# Patient Record
Sex: Female | Born: 1965
Health system: Southern US, Community
[De-identification: ages and names within clinical notes are randomized; demographics above are authoritative.]

## PROBLEM LIST (undated history)

## (undated) DIAGNOSIS — T8859XA Other complications of anesthesia, initial encounter: Secondary | ICD-10-CM

## (undated) DIAGNOSIS — Z87442 Personal history of urinary calculi: Secondary | ICD-10-CM

## (undated) DIAGNOSIS — A6920 Lyme disease, unspecified: Secondary | ICD-10-CM

## (undated) DIAGNOSIS — I10 Essential (primary) hypertension: Secondary | ICD-10-CM

## (undated) DIAGNOSIS — R32 Unspecified urinary incontinence: Secondary | ICD-10-CM

## (undated) DIAGNOSIS — T4145XA Adverse effect of unspecified anesthetic, initial encounter: Secondary | ICD-10-CM

## (undated) DIAGNOSIS — N921 Excessive and frequent menstruation with irregular cycle: Secondary | ICD-10-CM

## (undated) DIAGNOSIS — G4730 Sleep apnea, unspecified: Secondary | ICD-10-CM

## (undated) DIAGNOSIS — M4800 Spinal stenosis, site unspecified: Secondary | ICD-10-CM

## (undated) DIAGNOSIS — M1990 Unspecified osteoarthritis, unspecified site: Secondary | ICD-10-CM

## (undated) DIAGNOSIS — K219 Gastro-esophageal reflux disease without esophagitis: Secondary | ICD-10-CM

## (undated) DIAGNOSIS — F40240 Claustrophobia: Secondary | ICD-10-CM

## (undated) DIAGNOSIS — J45909 Unspecified asthma, uncomplicated: Secondary | ICD-10-CM

## (undated) HISTORY — DX: Lyme disease, unspecified: A69.20

## (undated) HISTORY — DX: Essential (primary) hypertension: I10

## (undated) HISTORY — DX: Spinal stenosis, site unspecified: M48.00

## (undated) HISTORY — DX: Excessive and frequent menstruation with irregular cycle: N92.1

---

## 1996-08-01 HISTORY — PX: LITHOTRIPSY: SUR834

## 2002-04-11 ENCOUNTER — Ambulatory Visit (HOSPITAL_COMMUNITY): Admission: RE | Admit: 2002-04-11 | Discharge: 2002-04-11 | Payer: Self-pay | Admitting: Pulmonary Disease

## 2002-07-29 ENCOUNTER — Other Ambulatory Visit: Admission: RE | Admit: 2002-07-29 | Discharge: 2002-07-29 | Payer: Self-pay | Admitting: Dermatology

## 2004-07-06 ENCOUNTER — Emergency Department (HOSPITAL_COMMUNITY): Admission: EM | Admit: 2004-07-06 | Discharge: 2004-07-06 | Payer: Self-pay | Admitting: Emergency Medicine

## 2005-06-16 ENCOUNTER — Emergency Department (HOSPITAL_COMMUNITY): Admission: EM | Admit: 2005-06-16 | Discharge: 2005-06-16 | Payer: Self-pay | Admitting: Emergency Medicine

## 2005-08-05 ENCOUNTER — Emergency Department (HOSPITAL_COMMUNITY): Admission: EM | Admit: 2005-08-05 | Discharge: 2005-08-05 | Payer: Self-pay | Admitting: Emergency Medicine

## 2010-04-22 ENCOUNTER — Ambulatory Visit: Payer: Self-pay | Admitting: Internal Medicine

## 2010-04-22 DIAGNOSIS — I10 Essential (primary) hypertension: Secondary | ICD-10-CM | POA: Insufficient documentation

## 2010-04-22 DIAGNOSIS — R059 Cough, unspecified: Secondary | ICD-10-CM | POA: Insufficient documentation

## 2010-04-22 DIAGNOSIS — J309 Allergic rhinitis, unspecified: Secondary | ICD-10-CM | POA: Insufficient documentation

## 2010-04-22 DIAGNOSIS — R05 Cough: Secondary | ICD-10-CM

## 2010-06-07 ENCOUNTER — Ambulatory Visit: Payer: Self-pay | Admitting: Internal Medicine

## 2010-07-29 ENCOUNTER — Telehealth (INDEPENDENT_AMBULATORY_CARE_PROVIDER_SITE_OTHER): Payer: Self-pay | Admitting: *Deleted

## 2010-08-31 NOTE — Assessment & Plan Note (Signed)
Summary: 6 weeks/ mbw   Visit Type:  Follow-up Copy to:  Dr. Herb Grays Primary Provider/Referring Provider:  Dr. Herb Grays   History of Present Illness: IOV 04/22/2010: 45 year old female. Referred for cough. Known to have seasonal allergies in spring or fall. STates she gots what she thought was an allergic reaction to change in season in July 2011. She thought though the timing was unusual. She did not think it was URI.  STarted off as runny nose, scratchy throat, hoarseness after talking. Next day she lost voice  and regained within 3 days but 1-2 days days later she developed cough with throat burn. Noticed associated post nasal drip at that time that was triggering the cough. She though symptoms was due to allergy but thought timing was odd. 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 Initially cough was 'violent' cough lasting 15 minutes that was associated with gag and wretching. Episodes such as this were happening every few hours in august. Cough was present day and night. IN Aug 2011 noted to some have relief by being in stting position. So, in last week of august was placed on tessalon pereles, doxycycline 4 day course, flonase, singulair,  claritin and pro-air as needed. Then on 04/06/2010 saw PMD Dr. Yehuda Budd and mucinex added, tussin added and zpak course given to others. This seems to have helped partially transiently but cough recurred with worsening. So on fu a 10 days ago  was placed on doxy (last dose today). Finished singular 4 days ago. Currently cough has improved in terms of frequency but still as intense as before with same associated gag.  Still taking delsym, tessalon, flonase, and claritin. Pro-air seems to help a bit with severe cough episodes. Still feels tickle in back of throat. Denies GERD   Of note,  LISINOPRIL was addted to HCTZ in March-April 2011  for better Hypertenson cotnrol.  Husband has ACE inhibitor cough. She trialled herself of ACE inhibitor but that did not help. REC advise  - stop lisinopril  - take bystolic 5mg  per day      - measure your bp at home or  mall and call us back if >140/90  - take acid reflux treatment  1 pill daily  - follow GERD advice sheet from my nurse  - use netti pot saline wash daily  - use claritin as needed  - suck on sugarless non-mint candy like Jolley Rancer or throat lozenges as needed  - stop other treatment return in 4 weeks   June 07, 2010: Cough is resolved. Did not do GERD measures. Did netti pot 2 times per week. Did not use claritin. Feels stopping ace inhibitor made biggest difference. Lozenges helping. No new problems. OUt of BP med x 7 days   Preventive Screening-Counseling & Management  Alcohol-Tobacco     Smoking Status: never  Current Medications (verified): 1)  Hydrochlorothiazide 25 Mg Tabs (Hydrochlorothiazide) .... Take 1 Tablet By Mouth Once A Day 2)  Bystolic 5 Mg Tabs (Nebivolol Hcl) .... Take 1 Tablet By Mouth Once A Day 3)  Claritin 10 Mg Tabs (Loratadine) .... As Needed  Allergies (verified): 1)  ! Hydrocodone  Past History:  Past medical, surgical, family and social histories (including risk factors) reviewed, and no changes noted (except as noted below).  Past Medical History: Reviewed history from 04/22/2010 and no changes required. Allergic Rhinitis Hypertension  Past Surgical History: Reviewed history from 04/21/2010 and no changes required. 2 c-sections 2 miscarriages lithotrypsy  Family History: Reviewed history from 04/22/2010 and no changes required. father: lung cancer mother: non-alcoholic fatty liver disease, hepatitis?  Social History: Reviewed history from 04/22/2010 and no changes required. Never Smoked Married 2 Engineer, technical sales  Review of Systems  The patient denies shortness of breath with activity, shortness of breath at rest, productive  cough, non-productive cough, coughing up blood, chest pain, irregular heartbeats, acid heartburn, indigestion, loss of appetite, weight change, abdominal pain, difficulty swallowing, sore throat, tooth/dental problems, headaches, nasal congestion/difficulty breathing through nose, sneezing, itching, ear ache, anxiety, depression, hand/feet swelling, joint stiffness or pain, rash, change in color of mucus, and fever.    Vital Signs:  Patient profile:   45 year old female Height:      67 inches Weight:      286 pounds BMI:     44.96 O2 Sat:      99 % on Room air Temp:     98.0 degrees F oral Pulse rate:   74 / minute BP sitting:   134 / 80  (right arm) Cuff size:   l67  Vitals Entered By: Carron Curie CMA (June 07, 2010 3:41 PM)  O2 Flow:  Room air Comments Medications reviewed with patient Carron Curie CMA  June 07, 2010 3:41 PM Daytime phone number verified with patient.    Physical Exam  General:  well developed, well nourished, in no acute distressobese.   Head:  normocephalic and atraumatic Eyes:  PERRLA/EOM intact; conjunctiva and sclera clear Ears:  TMs intact and clear with normal canals Nose:  no deformity, discharge, inflammation, or lesions Mouth:  no deformity or lesions Neck:  no masses, thyromegaly, or abnormal cervical nodes Chest Wall:  no deformities noted Lungs:  clear bilaterally to auscultation and percussion Heart:  regular rate and rhythm, S1, S2 without murmurs, rubs, gallops, or clicks Abdomen:  bowel sounds positive; abdomen soft and non-tender without masses, or organomegaly Msk:  no deformity or scoliosis noted with normal posture Pulses:  pulses normal Extremities:  no clubbing, cyanosis, edema, or deformity noted Neurologic:  CN II-XII grossly intact with normal reflexes, coordination, muscle strength and tone Skin:  intact without lesions or rashes Cervical Nodes:  no significant adenopathy Axillary Nodes:  no significant  adenopathy Psych:  alert and cooperative; normal mood and affect; normal attention span and concentration   Impression & Recommendations:  Problem # 1:  COUGH (ICD-786.2) Assessment Improved  resolved  plan no more ace inhibitors conintue saline wash and as needed lozenges  Orders: Est. Patient Level II (16109)  Problem # 2:  HYPERTENSION (ICD-401.9) Assessment: Unchanged  BP 130/80. Ran out of bystolic 1 week ago  plan 30 days x 5mg  daily bystolic samples instructed to follow with PMD for hypertnesipn control and appropriated drug she will call pharmacy and find out cost for bystolic which she likes  Her updated medication list for this problem includes:    Hydrochlorothiazide 25 Mg Tabs (Hydrochlorothiazide) .Marland Kitchen... Take 1 tablet by mouth once a day    Bystolic 5 Mg Tabs (Nebivolol hcl) .Marland Kitchen... Take 1 tablet by mouth once a day  Orders: Est. Patient Level II (60454)  Medications Added to Medication List This Visit: 1)  Bystolic 5 Mg Tabs (Nebivolol hcl) .... Take 1 tablet by mouth once a day 2)  Claritin 10 Mg Tabs (Loratadine) .... As needed  Patient Instructions: 1)  Glad  cough is gone 2)  continue netti pot and other measures 3)  take 30 day sample of bystolic 5mg  once daily 4)  talk to your pmd and follow with her for bp 5)  no need for active followup unless you have some thing new or cough recurs

## 2010-08-31 NOTE — Assessment & Plan Note (Signed)
Summary: CONSTANT COUGH///KP   Visit Type:  Initial Consult Copy to:  Dr. Herb Grays Primary Provider/Referring Provider:  Dr. Herb Grays  CC:  Pulmonary Consult for dry cough x 2 months..  History of Present Illness: IOV 04/22/2010: 46 year old female. Referred for cough. Known to have seasonal allergies in spring or fall. STates she gots what she thought was an allergic reaction to change in season in July 2011. She thought though the timing was unusual. She did not think it was URI.  STarted off as runny nose, scratchy throat, hoarseness after talking. Next day she lost voice  and regained within 3 days but 1-2 days days later she developed cough with throat burn. Noticed associated post nasal drip at that time that was triggering the cough. She though symptoms was due to allergy but thought timing was odd. 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 Initially cough was 'violent' cough lasting 15 minutes that was associated with gag and wretching. Episodes such as this were happening every few hours in august. Cough was present day and night. IN Aug 2011 noted to some have relief by being in stting position. So, in last week of august was placed on tessalon pereles, doxycycline 4 day course, flonase, singulair,  claritin and pro-air as needed. Then on 04/06/2010 saw PMD Dr. Yehuda Budd and mucinex added, tussin added and zpak course given to others. This seems to have helped partially transiently but cough recurred with worsening. So on fu a 10 days ago  was placed on doxy (last dose today). Finished singular 4 days ago. Currently cough has improved in terms of frequency but still as intense as before with same associated gag.  Still taking delsym, tessalon, flonase, and claritin. Pro-air seems to help a bit with severe cough episodes. Still feels tickle in back of throat. Denies GERD   Of note,  LISINOPRIL was addted to  HCTZ in March-April 2011  for better Hypertenson cotnrol. Husband has ACE inhibitor cough. She trialled herself of ACE inhibitor but that did not help.   Preventive Screening-Counseling & Management  Alcohol-Tobacco     Smoking Status: never  Current Medications (verified): 1)  Doxycycline Hyclate 100 Mg Tabs (Doxycycline Hyclate) .... Take 1 Tablet By Mouth Two Times A Day 2)  Tussionex Pennkinetic Er 10-8 Mg/76ml Lqcr (Hydrocod Polst-Chlorphen Polst) .... Take 1/2 To 1 Tsp By Mouth At Bedtime As Needed For Cough 3)  Tessalon 200 Mg Caps (Benzonatate) .... Take 1 Tablet By Mouth Three Times A Day As Needed For Cough 4)  Hydrochlorothiazide 25 Mg Tabs (Hydrochlorothiazide) .... Take 1 Tablet By Mouth Once A Day 5)  Lisinopril 10 Mg Tabs (Lisinopril) .... Take 1 Tablet By Mouth Once A Day  Allergies (verified): 1)  ! Hydrocodone  Past History:  Past Medical History: Allergic Rhinitis Hypertension  Family History: father: lung cancer mother: non-alcoholic fatty liver disease, hepatitis?  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  The patient denies shortness of breath with activity, shortness of breath at rest, productive cough, coughing up blood, chest pain, irregular heartbeats, acid heartburn, indigestion, loss of appetite, weight change, abdominal pain, difficulty swallowing, sore throat, tooth/dental problems, headaches, nasal congestion/difficulty breathing through nose, sneezing, itching, ear ache, anxiety, depression, hand/feet swelling, joint stiffness or pain, rash,  change in color of mucus, and fever.    Vital Signs:  Patient profile:   45 year old female Height:      67 inches Weight:      286 pounds BMI:     44.96 O2 Sat:      98 % on Room air Temp:     97.3 degrees F oral Pulse rate:   107 / minute BP sitting:   138 / 92  (right arm) Cuff  size:   large  Vitals Entered By: Carron Curie CMA (April 22, 2010 3:37 PM)  O2 Flow:  Room air CC: Pulmonary Consult for dry cough x 2 months. Comments Medications reviewed with patient Carron Curie CMA  April 22, 2010 3:42 PM Daytime phone number verified with patient.    Physical Exam  General:  well developed, well nourished, in no acute distressobese.   Head:  normocephalic and atraumatic Eyes:  PERRLA/EOM intact; conjunctiva and sclera clear Ears:  TMs intact and clear with normal canals Nose:  no deformity, discharge, inflammation, or lesions Mouth:  no deformity or lesions Neck:  no masses, thyromegaly, or abnormal cervical nodes Chest Wall:  no deformities noted Lungs:  clear bilaterally to auscultation and percussion Heart:  regular rate and rhythm, S1, S2 without murmurs, rubs, gallops, or clicks Abdomen:  bowel sounds positive; abdomen soft and non-tender without masses, or organomegaly Msk:  no deformity or scoliosis noted with normal posture Pulses:  pulses normal Extremities:  no clubbing, cyanosis, edema, or deformity noted Neurologic:  CN II-XII grossly intact with normal reflexes, coordination, muscle strength and tone Skin:  intact without lesions or rashes Cervical Nodes:  no significant adenopathy Axillary Nodes:  no significant adenopathy Psych:  alert and cooperative; normal mood and affect; normal attention span and concentration   CXR  Procedure date:  06/16/2005  Findings:      [facility] Accession Number: [id number]    Clinical Data:    Chest tightness.   CHEST - 2 VIEWS:   Comparison:   None.   The heart size and mediastinal contours are within normal limits.   Both lungs are clear.  The visualized skeletal structures are   unremarkable.   IMPRESSION:   No active cardiopulmonary disease.    Read By:  Bernerd Limbo,  M.D.   Released By:  Bernerd Limbo,  M.D.   Comments:      independently reviewed  Impression &  Recommendations:  Problem # 1:  COUGH (ICD-786.2) Assessment New  Post viral reactive cough in summer 2011 with lisinopril, post nasal drip +/- silent GERD fuelling the flame is the likely scenario here. She did not seem fully  convinced ACE inhibitor is playing a role but I explaind to her in great detail and she was accepting. Explained that would rec below Rx plan tacking ace inhibitor, sinus drainage, and possible GERD simultaneosuly and revieiwng after 4 weeks. If still symptomatic, would procced with asthma workup.  PLAN - stop lisinopril  - take bystolic 5mg  per day      - measure your bp at home or  mall and call us back if >140/90  - take acid reflux treatment  1 pill daily  - follow GERD advice sheet from my nurse  - use netti pot saline wash daily  - use claritin as needed  - suck on sugarless non-mint candy like Jolley Rancer or throat lozenges as needed  - stop other treatment   Orders: Consultation Level V (54098)  Problem # 2:  HYPERTENSION (ICD-401.9) Assessment: New change lisinopril to bystolic due to cough continue hctz Her updated medication list for this problem includes:    Hydrochlorothiazide 25 Mg Tabs (Hydrochlorothiazide) .Marland Kitchen... Take 1 tablet by mouth once a day    Bystolic 5 Mg Tabs (Nebivolol hcl) ..... One tablet daily  Medications Added to Medication List This Visit: 1)  Bystolic 5 Mg Tabs (Nebivolol hcl) .... One tablet daily 2)  Omeprazole 20 Mg Cpdr (Omeprazole) .... By mouth daily. take one half hour before eating.  Patient Instructions: 1)  your cough could be post viral, sinus drainage, lisinopril or even silent acid reflux or a combination of these 2)  advise 3)   - stop lisinopril 4)   - take bystolic 5mg  per day 5)       - measure your bp at home or  mall and call us back if >140/90 6)   - take acid reflux treatment  1 pill daily 7)   - follow GERD advice sheet from my nurse 8)   - use netti pot saline wash daily 9)   - use claritin as  needed 10)   - suck on sugarless non-mint candy like Jolley Rancer or throat lozenges as needed 11)   - stop other treatment  12)  return in 4 weeks Prescriptions: OMEPRAZOLE 20 MG  CPDR (OMEPRAZOLE) By mouth daily. Take one half hour before eating.  #30 x 1   Entered and Authorized by:   Kalman Shan MD   Signed by:   Kalman Shan MD on 04/22/2010   Method used:   Historical   RxID:   [id number] BYSTOLIC 5 MG  TABS (NEBIVOLOL HCL) One tablet daily  #30 x 1   Entered and Authorized by:   Kalman Shan MD   Signed by:   Kalman Shan MD on 04/22/2010   Method used:   Historical   RxID:   [id number]    Immunization History:  Influenza Immunization History:    Influenza:  fluvax 3+ (04/01/2010)

## 2010-09-02 NOTE — Progress Notes (Signed)
Summary: out of med -- MR pt -- needs recs for PCP---LMTCBx1  Phone Note Call from Patient Call back at Home Phone [phone]   Caller: Patient Call For: dr Marchelle Gearing Summary of Call: Patient phoned stated that Dr. Marchelle Gearing prescribed bystolic and her PCP wants to know what other options they have other than this medicine because this is a beta blocker. Patient can be reached [phone]   Initial call taken by: Vedia Coffer,  July 29, 2010 1:09 PM  Follow-up for Phone Call        Per last OV note from 11.7.11, pt was to take 30 day sample of bystolic 5mg  once daily and talk to her pmd and follow with her for bp.  Called, spoke with pt.  States she called PCP, Dr. Herb Grays, regarding this.  However, pcp is asking she call here for recs on what to change bystolic to bc  the meds she would change pt to have the same side effects as lisionpril.  Pt states bystolic needs to be changed bc it is too expensive and does not come in a generic.  Pt is out of bystilc samples since last Monday or Tuesday.  MR is not back in office until 08/05/10. Will forward message to Doc of the Day to address.  Dr. Maple Hudson, will you pls advise on alternative options for bystolic.  Thanks! Follow-up by: Gweneth Dimitri RN,  July 29, 2010 3:43 PM  Additional Follow-up for Phone Call Additional follow up Details #1::        Please let Dr Alda Berthold office know that, if beta blocker is ok, but cost is a problem, then I suggest she try metoprolol with or without HCTZ as a diuretic. She can call this from her office.  Additional Follow-up by: Waymon Budge MD,  July 29, 2010 4:58 PM    Additional Follow-up for Phone Call Additional follow up Details #2::    going to hold in triage and call Dr. Yehuda Budd office  in the AM to discuss this. Carron Curie CMA  July 29, 2010 5:19 PM  Spoke with Elmarie Shiley at Dr. Alda Berthold office and she will forward msg to Dr. Alda Berthold on CDY suggestion to try metoprolol with  or without diuretic.  If Dr. Alda Berthold has any other concerns their office will call us.  Will forward msg to MR so he is aware.Michel Bickers [facility]  July 30, 2010 10:21 AM   Additional Follow-up for Phone Call Additional follow up Details #3:: Details for Additional Follow-up Action Taken: agree with Dr Maple Hudson reommendations. Bisoprolol is also another options. Please check if patient got new bp med. If not, she is welcome to come here and get more bystolic samples.  I am happy to talk to Dr. Collins Scotland ifneeded Additional Follow-up by: Kalman Shan MD,  August 05, 2010 4:25 PM  LMTCBx1 with pt to see if she received BP medication. Carron Curie CMA  August 05, 2010 4:53 PM  Spoke with pt and she states that she was called by Dr Alda Berthold office and she has recieved her BP med.  She is unsure of the name but states, "it is whatever you all reccomended".  Vernie Murders  August 06, 2010 5:40 PM

## 2011-04-01 ENCOUNTER — Emergency Department (HOSPITAL_COMMUNITY)
Admission: EM | Admit: 2011-04-01 | Discharge: 2011-04-02 | Disposition: A | Discharge status: Home / Self Care | Payer: BC Managed Care – PPO | Attending: Emergency Medicine | Admitting: Emergency Medicine

## 2011-04-01 ENCOUNTER — Emergency Department (HOSPITAL_COMMUNITY): Payer: BC Managed Care – PPO

## 2011-04-01 DIAGNOSIS — R072 Precordial pain: Secondary | ICD-10-CM | POA: Insufficient documentation

## 2011-04-01 DIAGNOSIS — I10 Essential (primary) hypertension: Secondary | ICD-10-CM | POA: Insufficient documentation

## 2011-04-01 LAB — POCT I-STAT, CHEM 8
BUN: 13 mg/dL (ref 6–23)
Calcium, Ion: 1.2 mmol/L (ref 1.12–1.32)
Chloride: 100 mEq/L (ref 96–112)
Creatinine, Ser: 0.8 mg/dL (ref 0.50–1.10)
Glucose, Bld: 125 mg/dL — ABNORMAL HIGH (ref 70–99)
HCT: 40 % (ref 36.0–46.0)
Hemoglobin: 13.6 g/dL (ref 12.0–15.0)
Potassium: 3.4 mEq/L — ABNORMAL LOW (ref 3.5–5.1)
Sodium: 141 mEq/L (ref 135–145)
TCO2: 28 mmol/L (ref 0–100)

## 2011-04-01 LAB — POCT I-STAT TROPONIN I: Troponin i, poc: 0 ng/mL (ref 0.00–0.08)

## 2011-04-01 LAB — DIFFERENTIAL
Basophils Absolute: 0 10*3/uL (ref 0.0–0.1)
Basophils Relative: 0 % (ref 0–1)
Eosinophils Absolute: 0.1 10*3/uL (ref 0.0–0.7)
Eosinophils Relative: 1 % (ref 0–5)
Lymphocytes Relative: 33 % (ref 12–46)
Lymphs Abs: 3.2 10*3/uL (ref 0.7–4.0)
Monocytes Absolute: 0.6 10*3/uL (ref 0.1–1.0)
Monocytes Relative: 6 % (ref 3–12)
Neutro Abs: 5.6 10*3/uL (ref 1.7–7.7)
Neutrophils Relative %: 59 % (ref 43–77)

## 2011-04-01 LAB — CBC
HCT: 38.5 % (ref 36.0–46.0)
Hemoglobin: 13 g/dL (ref 12.0–15.0)
MCH: 28.2 pg (ref 26.0–34.0)
MCHC: 33.8 g/dL (ref 30.0–36.0)
MCV: 83.5 fL (ref 78.0–100.0)
Platelets: 328 10*3/uL (ref 150–400)
RBC: 4.61 MIL/uL (ref 3.87–5.11)
RDW: 13.3 % (ref 11.5–15.5)
WBC: 9.5 10*3/uL (ref 4.0–10.5)

## 2011-04-02 LAB — POCT I-STAT TROPONIN I: Troponin i, poc: 0 ng/mL (ref 0.00–0.08)

## 2012-09-14 ENCOUNTER — Encounter: Payer: Self-pay | Admitting: Gynecology

## 2012-09-14 DIAGNOSIS — N921 Excessive and frequent menstruation with irregular cycle: Secondary | ICD-10-CM | POA: Insufficient documentation

## 2012-11-19 ENCOUNTER — Encounter: Payer: Self-pay | Admitting: Gynecology

## 2012-11-19 ENCOUNTER — Ambulatory Visit (INDEPENDENT_AMBULATORY_CARE_PROVIDER_SITE_OTHER): Payer: BC Managed Care – PPO | Admitting: Gynecology

## 2012-11-19 ENCOUNTER — Ambulatory Visit: Payer: Self-pay | Admitting: Nurse Practitioner

## 2012-11-19 VITALS — BP 136/78 | Resp 12

## 2012-11-19 DIAGNOSIS — N920 Excessive and frequent menstruation with regular cycle: Secondary | ICD-10-CM

## 2012-11-19 DIAGNOSIS — N921 Excessive and frequent menstruation with irregular cycle: Secondary | ICD-10-CM

## 2012-11-19 MED ORDER — MEDROXYPROGESTERONE ACETATE 10 MG PO TABS: 10.0000 mg | ORAL_TABLET | Freq: Every day | ORAL | Status: DC

## 2012-11-19 NOTE — Progress Notes (Signed)
Pt here for follow up of provera treatment for menorrhagia now on for 3 cycles, initial cycle very heavy, subsequent cycles lighter and 3d of flow.  Pt reports clots still present but smaller.  Pt seems to tolerating provera well. As cycles are starting to improve, we discussed alternatives, such as the progestin IUD- risks and benefits discussed.  Questions addressed.  Pt would like to continue with the oral progestin at this point, as refill was given for the rest of the year.  We stressed the importance of progestin to decrease her risk of uterine cancer which is elevated based on her irregular menses and obesity.  She is contraception with vasectomy Pt was informed that she might have episodes of amenorrhea after taking progestin but ok to continue.  We will reassess at her annual exam in Deember  Length of visit >50% face t face discussing menorrhagia and risks of uterine cancer with obesity

## 2012-11-19 NOTE — Patient Instructions (Addendum)
Cycles should get lighter every month, ok if they stop.

## 2012-11-29 DIAGNOSIS — A6920 Lyme disease, unspecified: Secondary | ICD-10-CM

## 2012-11-29 HISTORY — DX: Lyme disease, unspecified: A69.20

## 2013-05-08 ENCOUNTER — Encounter: Payer: Self-pay | Admitting: Gynecology

## 2013-07-23 ENCOUNTER — Other Ambulatory Visit: Payer: Self-pay

## 2013-07-23 DIAGNOSIS — Z1231 Encounter for screening mammogram for malignant neoplasm of breast: Secondary | ICD-10-CM

## 2013-07-24 ENCOUNTER — Ambulatory Visit: Payer: BC Managed Care – PPO

## 2013-08-05 ENCOUNTER — Ambulatory Visit: Payer: Self-pay | Admitting: Gynecology

## 2013-08-07 ENCOUNTER — Ambulatory Visit
Admission: RE | Admit: 2013-08-07 | Discharge: 2013-08-07 | Disposition: A | Discharge status: Home / Self Care | Payer: BC Managed Care – PPO | Source: Ambulatory Visit

## 2013-08-07 DIAGNOSIS — Z1231 Encounter for screening mammogram for malignant neoplasm of breast: Secondary | ICD-10-CM

## 2013-08-13 ENCOUNTER — Other Ambulatory Visit: Payer: Self-pay | Admitting: Obstetrics & Gynecology

## 2013-08-13 DIAGNOSIS — R928 Other abnormal and inconclusive findings on diagnostic imaging of breast: Secondary | ICD-10-CM

## 2013-08-20 ENCOUNTER — Ambulatory Visit
Admission: RE | Admit: 2013-08-20 | Discharge: 2013-08-20 | Disposition: A | Discharge status: Home / Self Care | Payer: BC Managed Care – PPO | Source: Ambulatory Visit | Attending: Obstetrics & Gynecology | Admitting: Obstetrics & Gynecology

## 2013-08-20 DIAGNOSIS — R928 Other abnormal and inconclusive findings on diagnostic imaging of breast: Secondary | ICD-10-CM

## 2013-09-03 ENCOUNTER — Ambulatory Visit (INDEPENDENT_AMBULATORY_CARE_PROVIDER_SITE_OTHER): Payer: BC Managed Care – PPO | Admitting: Obstetrics & Gynecology

## 2013-09-03 ENCOUNTER — Encounter: Payer: Self-pay | Admitting: Obstetrics & Gynecology

## 2013-09-03 VITALS — BP 136/84 | HR 80 | Ht 66.0 in | Wt 293.0 lb

## 2013-09-03 DIAGNOSIS — E559 Vitamin D deficiency, unspecified: Secondary | ICD-10-CM

## 2013-09-03 DIAGNOSIS — Z0000 Encounter for general adult medical examination without abnormal findings: Secondary | ICD-10-CM

## 2013-09-03 DIAGNOSIS — Z01419 Encounter for gynecological examination (general) (routine) without abnormal findings: Secondary | ICD-10-CM

## 2013-09-03 DIAGNOSIS — Z124 Encounter for screening for malignant neoplasm of cervix: Secondary | ICD-10-CM

## 2013-09-03 LAB — LIPID PANEL
Cholesterol: 175 mg/dL (ref 0–200)
HDL: 66 mg/dL (ref >39)
LDL Cholesterol: 87 mg/dL (ref 0–99)
Total CHOL/HDL Ratio: 2.7 Ratio
Triglycerides: 108 mg/dL (ref <150)
VLDL: 22 mg/dL (ref 0–40)

## 2013-09-03 LAB — COMPREHENSIVE METABOLIC PANEL
ALT: 8 U/L (ref 0–35)
AST: 12 U/L (ref 0–37)
Albumin: 3.9 g/dL (ref 3.5–5.2)
Alkaline Phosphatase: 62 U/L (ref 39–117)
BUN: 11 mg/dL (ref 6–23)
CO2: 30 mEq/L (ref 19–32)
Calcium: 9 mg/dL (ref 8.4–10.5)
Chloride: 101 mEq/L (ref 96–112)
Creat: 0.7 mg/dL (ref 0.50–1.10)
Glucose, Bld: 81 mg/dL (ref 70–99)
Potassium: 4.4 mEq/L (ref 3.5–5.3)
Sodium: 135 mEq/L (ref 135–145)
Total Bilirubin: 0.4 mg/dL (ref 0.2–1.2)
Total Protein: 7.1 g/dL (ref 6.0–8.3)

## 2013-09-03 MED ORDER — LOSARTAN POTASSIUM 100 MG PO TABS: 100.0000 mg | ORAL_TABLET | Freq: Every day | ORAL | Status: DC

## 2013-09-03 NOTE — Patient Instructions (Signed)

## 2013-09-03 NOTE — Progress Notes (Signed)
Patient ID: Katelyn Cohen, female   DOB: 1966-07-09, 48 y.o.   MRN: [id number]  48 y.o. [id number] MarriedCaucasianF here for annual exam.  Diagnosed with lyme disease last year in may.  Treated with antibiotics.  Took about 4 months to really get over it.    Cycles are about every six weeks.  Flow is 3-4 days.  A day or two is heavy.    Last visit with Dr. Modena Morrow was in May.  Blood work done then.    Patient's last menstrual period was 08/08/2013.          Sexually active: yes  The current method of family planning is vasectomy.    Exercising: yes  Home exercise routine includes walking .5 hrs per day 3 times per week. Smoker:  no  Health Maintenance: Pap:  06/28/11, WNL, neg HR HPV History of abnormal Pap:  no MMG:  08/15/13, additional images on 06/20/14, Bi-Rads 1: negative, repeat in one year Colonoscopy:  n/a BMD:   n/a TDaP:  2010 Screening Labs: 2013, Hb today: declined, Urine today: declined   reports that she has never smoked. She has never used smokeless tobacco. She reports that she does not drink alcohol or use illicit drugs.  Past Medical History  Diagnosis Date  . Hypertension   . Menorrhagia with irregular cycle     Past Surgical History  Procedure Laterality Date  . Cesarean section    . Repeat cesarean section      Current Outpatient Prescriptions  Medication Sig Dispense Refill  . losartan (COZAAR) 100 MG tablet Take 100 mg by mouth daily.       No current facility-administered medications for this visit.    Family History  Problem Relation Age of Onset  . Other Mother     NASH, nonalcoholic steatohepatitis, chemo  . Hepatitis C Mother   . Cancer Father     lung cancer  . Breast cancer Maternal Grandmother     ROS:  Pertinent items are noted in HPI.  Otherwise, a comprehensive ROS was negative.  Exam:   BP 136/84  Pulse 80  Ht 5\' 6"  (1.676 m)  Wt 293 lb (132.904 kg)  BMI 47.31 kg/m2  LMP 08/08/2013  Weight change: +9lb    Height: 5\' 6"  (167.6  cm)  Ht Readings from Last 3 Encounters:  02/49/15 5\' 6"  (1.676 m)  11/49/11 5\' 7"  (1.702 m)  09/49/11 5\' 7"  (1.702 m)    General appearance: alert, cooperative and appears stated age Head: Normocephalic, without obvious abnormality, atraumatic Neck: no adenopathy, supple, symmetrical, trachea midline and thyroid normal to inspection and palpation Lungs: clear to auscultation bilaterally Breasts: normal appearance, no masses or tenderness Heart: regular rate and rhythm Abdomen: soft, non-tender; bowel sounds normal; no masses,  no organomegaly Extremities: extremities normal, atraumatic, no cyanosis or edema Skin: Skin color, texture, turgor normal. No rashes or lesions Lymph nodes: Cervical, supraclavicular, and axillary nodes normal. No abnormal inguinal nodes palpated Neurologic: Grossly normal   Pelvic: External genitalia:  no lesions              Urethra:  normal appearing urethra with no masses, tenderness or lesions              Bartholins and Skenes: normal                 Vagina: normal appearing vagina with normal color and discharge, no lesions  Cervix: no lesions              Pap taken: yes Bimanual Exam:  Uterus:  normal size, contour, position, consistency, mobility, non-tender              Adnexa: normal adnexa and no mass, fullness, tenderness               Rectovaginal: Confirms               Anus:  normal sphincter tone, no lesions  A:  Well Woman with normal exam H/O menorrhagia that has normalized H/O enlarged uterus without fibroids, probable adenomyosis Hypertension  P:   Mammogram yearly pap smear only today Losartin 100mg  daily.  #90/4RF. CMP, Lipids, TSH, Vit D return annually or prn  An After Visit Summary was printed and given to the patient.

## 2013-09-04 LAB — VITAMIN D 25 HYDROXY (VIT D DEFICIENCY, FRACTURES): Vit D, 25-Hydroxy: 10 ng/mL — ABNORMAL LOW (ref 30–89)

## 2013-09-04 LAB — IPS PAP SMEAR ONLY

## 2013-09-04 LAB — THYROID PANEL WITH TSH
Free Thyroxine Index: 3.4 (ref 1.0–3.9)
T3 Uptake: 33.9 % (ref 22.5–37.0)
T4, Total: 10.1 ug/dL (ref 5.0–12.5)
TSH: 2.005 u[IU]/mL (ref 0.350–4.500)

## 2013-09-04 MED ORDER — VITAMIN D (ERGOCALCIFEROL) 1.25 MG (50000 UNIT) PO CAPS: 50000.0000 [IU] | ORAL_CAPSULE | ORAL | Status: DC

## 2013-09-04 NOTE — Addendum Note (Signed)
Addended by: Megan Salon on: 09/04/2013 12:30 PM   Modules accepted: Orders

## 2013-09-09 ENCOUNTER — Telehealth: Payer: Self-pay

## 2013-09-09 NOTE — Telephone Encounter (Signed)
Lmtcb//kn 

## 2013-09-09 NOTE — Telephone Encounter (Signed)
Message copied by Robley Fries on Mon Sep 09, 2013  9:46 AM ------      Message from: Megan Salon      Created: Wed Sep 04, 2013 12:28 PM       Inform lipids great, cmp nl, tsh and panel normal,  VIt D is 10!  Needs 50K IU weekly for 12 weeks and repeat labs then.  Orders placed.  Please schedule labs. ------

## 2013-11-28 ENCOUNTER — Other Ambulatory Visit: Payer: Self-pay | Admitting: Obstetrics & Gynecology

## 2013-12-05 ENCOUNTER — Telehealth: Payer: Self-pay | Admitting: Obstetrics & Gynecology

## 2013-12-05 ENCOUNTER — Other Ambulatory Visit: Payer: BC Managed Care – PPO

## 2013-12-05 NOTE — Telephone Encounter (Signed)
Thank you. Encounter closed. 

## 2013-12-05 NOTE — Telephone Encounter (Signed)
Left message for pt to call and reschedule her missed lab appointment. °

## 2014-01-14 NOTE — Telephone Encounter (Signed)
Patient notified of all results.//kn 

## 2014-06-02 ENCOUNTER — Encounter: Payer: Self-pay | Admitting: Obstetrics & Gynecology

## 2014-09-12 ENCOUNTER — Encounter: Payer: Self-pay | Admitting: Obstetrics & Gynecology

## 2014-09-12 ENCOUNTER — Ambulatory Visit (INDEPENDENT_AMBULATORY_CARE_PROVIDER_SITE_OTHER): Payer: BLUE CROSS/BLUE SHIELD | Admitting: Obstetrics & Gynecology

## 2014-09-12 VITALS — BP 142/98 | HR 80 | Resp 20 | Ht 65.75 in | Wt 309.8 lb

## 2014-09-12 DIAGNOSIS — I10 Essential (primary) hypertension: Secondary | ICD-10-CM

## 2014-09-12 DIAGNOSIS — Z01419 Encounter for gynecological examination (general) (routine) without abnormal findings: Secondary | ICD-10-CM

## 2014-09-12 DIAGNOSIS — N938 Other specified abnormal uterine and vaginal bleeding: Secondary | ICD-10-CM

## 2014-09-12 DIAGNOSIS — Z0000 Encounter for general adult medical examination without abnormal findings: Secondary | ICD-10-CM

## 2014-09-12 LAB — COMPREHENSIVE METABOLIC PANEL
ALT: 10 U/L (ref 0–35)
AST: 14 U/L (ref 0–37)
Albumin: 3.7 g/dL (ref 3.5–5.2)
Alkaline Phosphatase: 58 U/L (ref 39–117)
BUN: 10 mg/dL (ref 6–23)
CO2: 28 mEq/L (ref 19–32)
Calcium: 9.2 mg/dL (ref 8.4–10.5)
Chloride: 103 mEq/L (ref 96–112)
Creat: 0.6 mg/dL (ref 0.50–1.10)
Glucose, Bld: 90 mg/dL (ref 70–99)
Potassium: 4.1 mEq/L (ref 3.5–5.3)
Sodium: 140 mEq/L (ref 135–145)
Total Bilirubin: 0.6 mg/dL (ref 0.2–1.2)
Total Protein: 7.1 g/dL (ref 6.0–8.3)

## 2014-09-12 LAB — POCT URINALYSIS DIPSTICK
Bilirubin, UA: NEGATIVE
Glucose, UA: NEGATIVE
Ketones, UA: NEGATIVE
Nitrite, UA: NEGATIVE
Protein, UA: NEGATIVE
Urobilinogen, UA: NEGATIVE
pH, UA: 5

## 2014-09-12 LAB — TSH: TSH: 2.267 u[IU]/mL (ref 0.350–4.500)

## 2014-09-12 MED ORDER — SOLIFENACIN SUCCINATE 5 MG PO TABS: 5.0000 mg | ORAL_TABLET | Freq: Every day | ORAL | Status: DC

## 2014-09-12 NOTE — Progress Notes (Addendum)
49 y.o. [id number] MarriedCaucasianF here for annual exam.  Daughter getting married in a month.  They are busy with planning.  Cycles are regular but reports she skips a cycle about every third for forth one.  Flow is typically light with cycles.  If she does skip a cycle, the next flow will be heavier.  Normal flow is 3 days.     Pt reports frustration with weight.  Reports she had lost down to 200 but is now above 265 without a lot of change in eating habits.  Reports urinary frequency is increased.  Having urine leakage with cough or sneeze.  Patient's last menstrual period was 09/08/2014.          Sexually active: Yes.    The current method of family planning is vasectomy.    Exercising: Yes.    walking and swimming Smoker:  no  Health Maintenance: Pap:  09/03/13 WNL History of abnormal Pap:  no MMG:  08/07/13-MMG, 08/20/13-left diag-screening one year Colonoscopy:  none BMD:   none TDaP:  2010 Screening Labs: today, Hb today: pending, Urine today: WBC-1+, RBC-2+ (on cycle)   reports that she has never smoked. She has never used smokeless tobacco. She reports that she does not drink alcohol or use illicit drugs.  Past Medical History  Diagnosis Date  . Hypertension   . Menorrhagia with irregular cycle   . Lyme disease 5/14    Past Surgical History  Procedure Laterality Date  . Cesarean section    . Repeat cesarean section      Current Outpatient Prescriptions  Medication Sig Dispense Refill  . losartan (COZAAR) 100 MG tablet Take 1 tablet (100 mg total) by mouth daily. 90 tablet 4   No current facility-administered medications for this visit.    Family History  Problem Relation Age of Onset  . Liver disease Mother     nonalcoholic fatty liver disease  . Hepatitis C Mother   . Cancer Father     lung cancer  . Breast cancer Maternal Grandmother     ROS:  Pertinent items are noted in HPI.  Otherwise, a comprehensive ROS was negative.  Exam:   BP 142/98 mmHg  Pulse  80  Resp 20  Ht 5' 5.75" (1.67 m)  Wt 309 lb 12.8 oz (140.524 kg)  BMI 50.39 kg/m2  LMP 09/08/2014  Weight change: +16#   Height: 5' 5.75" (167 cm)  Ht Readings from Last 3 Encounters:  09/12/14 5' 5.75" (1.67 m)  09/03/13 5\' 6"  (1.676 m)  06/07/10 5\' 7"  (1.702 m)    General appearance: alert, cooperative and appears stated age Head: Normocephalic, without obvious abnormality, atraumatic Neck: no adenopathy, supple, symmetrical, trachea midline and thyroid normal to inspection and palpation Lungs: clear to auscultation bilaterally Breasts: normal appearance, no masses or tenderness Heart: regular rate and rhythm Abdomen: soft, non-tender; bowel sounds normal; no masses,  no organomegaly Extremities: extremities normal, atraumatic, no cyanosis or edema Skin: Skin color, texture, turgor normal. No rashes or lesions Lymph nodes: Cervical, supraclavicular, and axillary nodes normal. No abnormal inguinal nodes palpated Neurologic: Grossly normal   Pelvic: External genitalia:  no lesions              Urethra:  normal appearing urethra with no masses, tenderness or lesions              Bartholins and Skenes: normal                 Vagina: normal  appearing vagina with normal color and discharge, no lesions              Cervix: no lesions              Pap taken: No. Bimanual Exam:  Uterus:  normal size, contour, position, consistency, mobility, non-tender              Adnexa: normal adnexa and no mass, fullness, tenderness               Rectovaginal: Confirms               Anus:  normal sphincter tone, no lesions  Chaperone was present for exam.  A:  Well Woman with normal exam Perimenopausal bleeding.  Precautions given for calling with heavy flow and/or long cycles H/O enlarged uterus without fibroids, probable adenomyosis Hypertension OAB Morbid obesity  P: Mammogram yearly pap smear 2015.  Last HR HPV 2012.  No pap today Referral for new PCP as Dr. Modena Morrow is  retiring CMP and Vit D TSH and FSH today Trial of vesicare 37m daily.  rx to pharmacy.  Pt knows to call if having dry eyes, dry mouth and/or constipation. return annually or prn

## 2014-09-13 LAB — VITAMIN D 25 HYDROXY (VIT D DEFICIENCY, FRACTURES): Vit D, 25-Hydroxy: 7 ng/mL — ABNORMAL LOW (ref 30–100)

## 2014-09-13 LAB — FOLLICLE STIMULATING HORMONE: FSH: 8.8 m[IU]/mL

## 2014-09-15 LAB — HEMOGLOBIN, FINGERSTICK: Hemoglobin, fingerstick: 12.6 g/dL (ref 12.0–16.0)

## 2014-09-19 NOTE — Addendum Note (Signed)
Addended by: Megan Salon on: 09/19/2014 06:24 PM   Modules accepted: Miquel Dunn

## 2014-09-22 ENCOUNTER — Telehealth: Payer: Self-pay

## 2014-09-22 MED ORDER — OXYBUTYNIN CHLORIDE ER 10 MG PO TB24: 10.0000 mg | ORAL_TABLET | Freq: Every day | ORAL | Status: DC

## 2014-09-22 MED ORDER — VITAMIN D (ERGOCALCIFEROL) 1.25 MG (50000 UNIT) PO CAPS: 50000.0000 [IU] | ORAL_CAPSULE | ORAL | Status: DC

## 2014-09-22 NOTE — Telephone Encounter (Signed)
-----   Message from Lyman Speller, MD sent at 09/19/2014  6:24 PM EST ----- Please call pt.  CMP was normal.  TSH normal.  Vit D very low.  Needs 50K IU weekly for 12 weeks and needs repeat Vit D.  Orders are not placed for either of these.  Please advise pt if she has any longer/heavier cycles, I would like to be aware.  Thanks.

## 2014-09-22 NOTE — Telephone Encounter (Signed)
Spoke with patient. Advised that we have received a letter from [facility] regarding coverage of Vesicare prescription. Rx will not be covered.  Dr.Silva has reviewed alternatives that are covered under her insurance and would like patient to try Ditropan XL 10mg  po daily #30 2RF.  Patient is agreeable and verbalizes understanding. Follow up appointment scheduled for 11/07/2014 at 3pm with Dr.Miller. Patient is agreeable to date and time.  Routing to Dr.Silva for review before closing.

## 2014-09-22 NOTE — Telephone Encounter (Signed)
We discussed the Ditropan Xl 10 mg daily and the recheck with Dr. Sabra Heck in 6 weeks. Encounter closed.

## 2014-09-22 NOTE — Telephone Encounter (Signed)
Lmtcb//kn 

## 2014-09-22 NOTE — Telephone Encounter (Signed)
Spoke with patient. Results given. Patient is agreeable and verbalizes understanding. Patient has a 6 week follow up with Dr.Miller for new medication start and will schedule 12 month lab follow up at that time. Rx for Vitamin D 50000 IU weekly #12 0RF sent to pharmacy on file. Patient is agreeable.  Routing to [place] for final review as Dr.Miller is out of the office. Patient agreeable to disposition. Will close encounter

## 2014-10-07 ENCOUNTER — Telehealth: Payer: Self-pay | Admitting: Obstetrics & Gynecology

## 2014-10-07 NOTE — Telephone Encounter (Signed)
Tammy from [facility] called to let me know that they have made multiple attempts to contact this patient for scheduling.The patient is not responding. They will just wait to see if she call back at a later time.

## 2014-10-07 NOTE — Telephone Encounter (Signed)
Thank you for the information.  Encounter closed. 

## 2014-11-06 ENCOUNTER — Telehealth: Payer: Self-pay | Admitting: Obstetrics & Gynecology

## 2014-11-06 NOTE — Telephone Encounter (Signed)
Patient canceled her appointment for med reck. She states she needs to take the medication a little longer before she comes in for an appointment. She will call to reschedule at a later time.

## 2014-11-07 ENCOUNTER — Ambulatory Visit: Payer: BLUE CROSS/BLUE SHIELD | Admitting: Obstetrics & Gynecology

## 2014-11-07 NOTE — Telephone Encounter (Signed)
Pt placed in reminder to check on in one month.

## 2014-11-18 ENCOUNTER — Other Ambulatory Visit: Payer: Self-pay | Admitting: Cardiology

## 2014-11-18 ENCOUNTER — Ambulatory Visit
Admission: RE | Admit: 2014-11-18 | Discharge: 2014-11-18 | Disposition: A | Discharge status: Home / Self Care | Payer: BLUE CROSS/BLUE SHIELD | Source: Ambulatory Visit | Attending: Cardiology | Admitting: Cardiology

## 2014-11-18 DIAGNOSIS — R0602 Shortness of breath: Secondary | ICD-10-CM

## 2014-11-25 ENCOUNTER — Other Ambulatory Visit: Payer: Self-pay | Admitting: Obstetrics & Gynecology

## 2014-11-25 NOTE — Telephone Encounter (Signed)
Medication refill request: Cozaar 100 mg Last AEX:  09/12/14 SM Next AEX: 11/20/15 SM Last MMG (if hormonal medication request): 08/20/13 BIRADS1:Neg Refill authorized: 09/03/13 #90tabs/4R. Today please advise.

## 2014-11-25 NOTE — Telephone Encounter (Signed)
LM for pt to call back.

## 2014-11-25 NOTE — Telephone Encounter (Signed)
This needs to come from her PCP.  Please call pt and/or pharmacy and advise.  Thanks.  Rx denied.

## 2015-01-06 ENCOUNTER — Other Ambulatory Visit: Payer: Self-pay | Admitting: Obstetrics & Gynecology

## 2015-01-06 DIAGNOSIS — E559 Vitamin D deficiency, unspecified: Secondary | ICD-10-CM

## 2015-01-06 NOTE — Telephone Encounter (Signed)
Pt states she would like her Vitamin D and OxyButin (one refill left) renewed.

## 2015-01-23 MED ORDER — OXYBUTYNIN CHLORIDE ER 10 MG PO TB24: 10.0000 mg | ORAL_TABLET | Freq: Every day | ORAL | Status: DC

## 2015-01-23 NOTE — Telephone Encounter (Signed)
Refill request for DITROPAN Last filled by MD on - 09/22/14, #30 X 2 Last AEX - 09/12/14 Next AEX - 11/20/15 Last MMG (if hormonal medication request) - N/A  Refill request for VITAMIN D Last filled by MD on - 09/22/14  I spoke with the patient regarding refills.  Pt cancelled follow up appt with Dr. Sabra Heck in April for Ditropan.  Pt states she is doing well on Ditropan.  Urinary frequency and leaking are resolved on medication.  Pt missed a couple of pills and noticed symptoms returned.  Resolved once resumed medication.  Pt has run out of Vitamin D since calling for refill.  Pt is scheduled for follow up lab on Monday.  Advised pt we usually like to have results prior to sending refill.  I apologized to patient that it took so long to return her call to get this taken care of.  Pt is appreciative of return call and voices understanding.  Please advise refills.

## 2015-01-23 NOTE — Telephone Encounter (Signed)
This phone note was opened by a temporary staff member but the call was not routed. Routing to clinical staff be sure this has been addressed. This encounter will need to be closed if no follow up is needed please.

## 2015-01-26 ENCOUNTER — Other Ambulatory Visit (INDEPENDENT_AMBULATORY_CARE_PROVIDER_SITE_OTHER): Payer: BLUE CROSS/BLUE SHIELD

## 2015-01-26 DIAGNOSIS — E559 Vitamin D deficiency, unspecified: Secondary | ICD-10-CM

## 2015-01-26 DIAGNOSIS — D509 Iron deficiency anemia, unspecified: Secondary | ICD-10-CM

## 2015-01-26 NOTE — Addendum Note (Signed)
Addended by: Graylon Good on: 01/26/2015 09:35 AM   Modules accepted: Orders

## 2015-01-27 LAB — VITAMIN D 25 HYDROXY (VIT D DEFICIENCY, FRACTURES): Vit D, 25-Hydroxy: 15 ng/mL — ABNORMAL LOW (ref 30–100)

## 2015-02-03 ENCOUNTER — Telehealth: Payer: Self-pay

## 2015-02-03 MED ORDER — VITAMIN D (ERGOCALCIFEROL) 1.25 MG (50000 UNIT) PO CAPS: 50000.0000 [IU] | ORAL_CAPSULE | ORAL | Status: DC

## 2015-02-03 NOTE — Telephone Encounter (Signed)
-----   Message from Megan Salon, MD sent at 01/28/2015  3:27 PM EDT ----- Please inform this is still low.  Did she take the prescription dosage?  If not, she needs to.  If so, needs to take it 50K twice weekly and repeat lab 12 weeks.  No orders placed yet.

## 2015-02-03 NOTE — Telephone Encounter (Signed)
Patient notified of results. States had been taking the Vitamin D weekly, but ran out for the last 3 weeks prior to lab recheck. Aware will check with Dr Sabra Heck to see if this will change any recommendations. Rx for Vitamin D called to pharmacy for twice weekly. Appointment scheduled for recheck on 05/07/15, unless changes made by Dr Sabra Heck. Please advise.//kn

## 2015-02-03 NOTE — Telephone Encounter (Signed)
Lmtcb//kn 

## 2015-02-05 NOTE — Telephone Encounter (Signed)
Agree with increasing to twice weekly and rechecking level 12 weeks.  Thanks.  Ok to close encounter.

## 2015-02-05 NOTE — Telephone Encounter (Signed)
Lmtcb//kn 

## 2015-02-25 ENCOUNTER — Other Ambulatory Visit: Payer: Self-pay | Admitting: *Deleted

## 2015-02-25 NOTE — Telephone Encounter (Signed)
Medication refill request: Oxybutynin (Ditropan XL) 10 mg Last AEX:  09/12/14 with SM Next AEX: 11/20/15 with SM Last MMG (if hormonal medication request): n/a Refill authorized: Please advise.

## 2015-02-26 MED ORDER — OXYBUTYNIN CHLORIDE ER 10 MG PO TB24: 10.0000 mg | ORAL_TABLET | Freq: Every day | ORAL | Status: DC

## 2015-03-03 ENCOUNTER — Other Ambulatory Visit: Payer: Self-pay | Admitting: Obstetrics and Gynecology

## 2015-03-03 NOTE — Telephone Encounter (Signed)
02/26/15 #90/4 rfs [facility]- rx denied.

## 2015-03-23 NOTE — Telephone Encounter (Signed)
Aware will only call her back with any changes.//kn

## 2015-05-07 ENCOUNTER — Other Ambulatory Visit (INDEPENDENT_AMBULATORY_CARE_PROVIDER_SITE_OTHER): Payer: BLUE CROSS/BLUE SHIELD

## 2015-05-07 DIAGNOSIS — E559 Vitamin D deficiency, unspecified: Secondary | ICD-10-CM

## 2015-05-08 LAB — VITAMIN D 25 HYDROXY (VIT D DEFICIENCY, FRACTURES): Vit D, 25-Hydroxy: 29 ng/mL — ABNORMAL LOW (ref 30–100)

## 2015-05-12 ENCOUNTER — Telehealth: Payer: Self-pay

## 2015-05-12 NOTE — Telephone Encounter (Signed)
Lmtcb//kn 

## 2015-05-12 NOTE — Telephone Encounter (Signed)
-----   Message from Megan Salon, MD sent at 05/11/2015  4:55 PM EDT ----- Please inform Vit D level is just 29.  Continue 50,000 IU Vit D weekly.  Repeat next year at AEX.

## 2015-05-19 NOTE — Telephone Encounter (Signed)
Patient notified of results.//kn 

## 2015-09-02 ENCOUNTER — Other Ambulatory Visit: Payer: Self-pay | Admitting: Obstetrics & Gynecology

## 2015-09-02 NOTE — Telephone Encounter (Signed)
Medication refill request: Drisdol Last AEX:  09/12/14 MSM Next AEX: 11/20/15 MSM Last MMG (if hormonal medication request): 08/20/2014 BIRADS Category 1 negative Refill authorized: 02/03/15 #24 capsules 0 Refills  Today #24 caps 0 Refills ? Please advise

## 2015-11-20 ENCOUNTER — Ambulatory Visit (INDEPENDENT_AMBULATORY_CARE_PROVIDER_SITE_OTHER): Payer: BLUE CROSS/BLUE SHIELD | Admitting: Obstetrics & Gynecology

## 2015-11-20 ENCOUNTER — Encounter: Payer: Self-pay | Admitting: Obstetrics & Gynecology

## 2015-11-20 ENCOUNTER — Telehealth: Payer: Self-pay | Admitting: Obstetrics & Gynecology

## 2015-11-20 VITALS — BP 130/92 | HR 88 | Resp 16 | Ht 66.0 in | Wt 320.0 lb

## 2015-11-20 DIAGNOSIS — Z1211 Encounter for screening for malignant neoplasm of colon: Secondary | ICD-10-CM | POA: Diagnosis not present

## 2015-11-20 DIAGNOSIS — Z0000 Encounter for general adult medical examination without abnormal findings: Secondary | ICD-10-CM

## 2015-11-20 DIAGNOSIS — Z1151 Encounter for screening for human papillomavirus (HPV): Secondary | ICD-10-CM | POA: Diagnosis not present

## 2015-11-20 DIAGNOSIS — Z124 Encounter for screening for malignant neoplasm of cervix: Secondary | ICD-10-CM

## 2015-11-20 DIAGNOSIS — Z01419 Encounter for gynecological examination (general) (routine) without abnormal findings: Secondary | ICD-10-CM

## 2015-11-20 DIAGNOSIS — E669 Obesity, unspecified: Secondary | ICD-10-CM | POA: Insufficient documentation

## 2015-11-20 DIAGNOSIS — N921 Excessive and frequent menstruation with irregular cycle: Secondary | ICD-10-CM

## 2015-11-20 LAB — POCT URINALYSIS DIPSTICK
Bilirubin, UA: NEGATIVE
Blood, UA: NEGATIVE
Glucose, UA: NEGATIVE
Ketones, UA: NEGATIVE
Leukocytes, UA: NEGATIVE
Nitrite, UA: NEGATIVE
Protein, UA: NEGATIVE
Urobilinogen, UA: NEGATIVE
pH, UA: 5

## 2015-11-20 MED ORDER — FESOTERODINE FUMARATE ER 4 MG PO TB24: 4.0000 mg | ORAL_TABLET | Freq: Every day | ORAL | Status: DC

## 2015-11-20 MED ORDER — VITAMIN D (ERGOCALCIFEROL) 1.25 MG (50000 UNIT) PO CAPS: ORAL_CAPSULE | ORAL | Status: DC

## 2015-11-20 NOTE — Progress Notes (Signed)
50 y.o. [id number] MarriedCaucasianF here for annual exam.  Is skipping cycles two or three months at a time.  When she does have a cycle, it is heavy and lasts 5-6 days.  Oxybutinin has not worked well for her.  At times, she feels   Did see cardiologist last year and evaluation was normal.  He advised increased exercise and weight loss.  considering bariatric surgery.  PCP:  Dr Drema Dallas.  Pt did have blood work early in the year.  Pt does not know what the results were.    Patient's last menstrual period was 09/29/2015.          Sexually active: Yes.    The current method of family planning is vasectomy.    Exercising: Yes.    Swimming, walking Smoker:  no  Health Maintenance: Pap:  09/03/13 Neg.  Neg HR HPV 2012. History of abnormal Pap:  no MMG:  08/20/13 Diagnostic Left BIRADS1:neg Colonoscopy:  Never BMD:   Never TDaP:  2010 Screening Labs: PCP, Urine today: Negative   reports that she has never smoked. She has never used smokeless tobacco. She reports that she does not drink alcohol or use illicit drugs.  Past Medical History  Diagnosis Date  . Hypertension   . Menorrhagia with irregular cycle   . Lyme disease 5/14    Past Surgical History  Procedure Laterality Date  . Cesarean section    . Repeat cesarean section      Current Outpatient Prescriptions  Medication Sig Dispense Refill  . benzonatate (TESSALON) 100 MG capsule as needed.  1  . hydrochlorothiazide (HYDRODIURIL) 25 MG tablet Take 25 mg by mouth daily.  3  . losartan (COZAAR) 100 MG tablet Take 1 tablet (100 mg total) by mouth daily. 90 tablet 4  . meloxicam (MOBIC) 15 MG tablet Take 1 tablet by mouth daily as needed.  2  . oxybutynin (DITROPAN-XL) 10 MG 24 hr tablet Take 1 tablet (10 mg total) by mouth at bedtime. 90 tablet 4  . PROAIR HFA 108 (90 Base) MCG/ACT inhaler as needed.  0  . QVAR 40 MCG/ACT inhaler daily.  1  . Vitamin D, Ergocalciferol, (DRISDOL) 50000 units CAPS capsule TAKE ONE CAPSULE BY MOUTH  TWICE A WEEK (Patient taking differently: TAKE ONE CAPSULE BY MOUTH once A WEEK) 24 capsule 0   No current facility-administered medications for this visit.    Family History  Problem Relation Age of Onset  . Liver disease Mother     nonalcoholic fatty liver disease  . Hepatitis C Mother   . Cancer Father     lung cancer  . Breast cancer Maternal Grandmother     ROS:  Pertinent items are noted in HPI.  Otherwise, a comprehensive ROS was negative.  Exam:   BP 130/92 mmHg  Pulse 88  Resp 16  Ht 5\' 6"  (1.676 m)  Wt 320 lb (145.151 kg)  BMI 51.67 kg/m2  LMP 09/29/2015  Weight change: -11#  Height: 5\' 6"  (167.6 cm)  Ht Readings from Last 3 Encounters:  11/20/15 5\' 6"  (1.676 m)  09/12/14 5' 5.75" (1.67 m)  09/03/13 5\' 6"  (1.676 m)   General appearance: alert, cooperative and appears stated age Head: Normocephalic, without obvious abnormality, atraumatic Neck: no adenopathy, supple, symmetrical, trachea midline and thyroid normal to inspection and palpation Lungs: clear to auscultation bilaterally Breasts: normal appearance, no masses or tenderness Heart: regular rate and rhythm Abdomen: soft, non-tender; bowel sounds normal; no masses,  no organomegaly Extremities:  extremities normal, atraumatic, no cyanosis or edema Skin: Skin color, texture, turgor normal. No rashes or lesions Lymph nodes: Cervical, supraclavicular, and axillary nodes normal. No abnormal inguinal nodes palpated Neurologic: Grossly normal   Pelvic: External genitalia:  no lesions              Urethra:  normal appearing urethra with no masses, tenderness or lesions              Bartholins and Skenes: normal                 Vagina: normal appearing vagina with normal color and discharge, no lesions              Cervix: no lesions              Pap taken: Yes.   Bimanual Exam:  Uterus:  normal size, contour, position, consistency, mobility, non-tender.  Very difficult exam due to obesity.               Adnexa: normal adnexa and no mass, fullness, tenderness               Rectovaginal: Confirms               Anus:  normal sphincter tone, no lesions  Chaperone was present for exam.  A:  Well Woman with normal exam Perimenopausal bleeding. Precautions given for calling with heavy flow and/or long cycles H/O enlarged uterus without fibroids, probable adenomyosis Hypertension OAB Morbid obesity with inadequate physical exam  P: Mammogram yearly.  Pt aware she is overdue. pap smear 2015. Last HR HPV 2012. Pap with HR HPV. Referral to Dr. Collene Mares for colonoscopy screening placed. Order for ultrasound placed. Will get copy of lab work from Dr. Drema Dallas done earlier this year. Switch from oxybutinin to Toviaz 4mg .  #30/13RF.  Pt reminded about side effects of dry eyes, dry mouth and/or constipation.  May need to do precert.  return annually or prn

## 2015-11-20 NOTE — Telephone Encounter (Signed)
Faxed Medication refill request from [facility] states "Toviaz 4 mg product not covered can use Oxybutynin ext-release or Trospium Orexcnon"  Dr. Bing Quarry 4 mg was sent today but is not covered please approve alternative if there is one, thanks!

## 2015-11-20 NOTE — Telephone Encounter (Signed)
I've given this to triage to see if prior-auth can be done as pt has been on oxybutynin and failed.  Thanks.

## 2015-11-23 NOTE — Telephone Encounter (Signed)
OK for Sanctura XR 60 mg daily.  #30, RF 11. Please let Dr. Sabra Heck know how she is doing after 6 weeks of therapy.

## 2015-11-23 NOTE — Telephone Encounter (Addendum)
Prior authorization for Toviaz 4 mg sent to Covermymeds. [id number]  Patient has [employer] coverage.

## 2015-11-23 NOTE — Telephone Encounter (Signed)
Call to [place].  Prior authorization for Toviaz 4 mg has been denied.  Patient must try and fail 3 alternatives. Patient has failed Oxybutinin (Ditropan XL 10 mg 24 hr tablet).   Formulary alternatives for patient include Sanctura (Trospium) 20 or 60 mg ER tablets or Detrol LA.   Routing to Dr. Quincy Simmonds to review and advise for alternatives.

## 2015-11-24 LAB — IPS PAP TEST WITH HPV

## 2015-11-24 MED ORDER — TROSPIUM CHLORIDE ER 60 MG PO CP24: 60.0000 mg | ORAL_CAPSULE | ORAL | Status: DC

## 2015-11-24 NOTE — Telephone Encounter (Signed)
Call to patient. Update provided regarding insurance denial for Toviaz.  Advised she must try three alternatives first. She states medication she is on now is not helping at all.  Advised that Dr Quincy Simmonds has reviewed chart while Dr Sabra Heck is out and has ordered new medication to try. She should call with update in 6 weeks and give [place] progress report on how new medication is working.  Will start with 30 day supply with refills until determining if this medication is effective. She can always increase to 90 day supply if desired.  Patient agreeable.    Routing to provider for final review. Patient agreeable to disposition. Will close encounter.    Routing to Dr Quincy Simmonds, covering for Dr Sabra Heck, out of office.

## 2015-12-03 ENCOUNTER — Other Ambulatory Visit: Payer: BLUE CROSS/BLUE SHIELD | Admitting: Obstetrics & Gynecology

## 2015-12-03 ENCOUNTER — Other Ambulatory Visit: Payer: BLUE CROSS/BLUE SHIELD

## 2016-01-05 DIAGNOSIS — Z1211 Encounter for screening for malignant neoplasm of colon: Secondary | ICD-10-CM | POA: Diagnosis not present

## 2016-03-10 ENCOUNTER — Telehealth: Payer: Self-pay | Admitting: Obstetrics & Gynecology

## 2016-03-10 NOTE — Telephone Encounter (Signed)
Medication refill request: Oxybutynin 10mg  Last AEX:  11/20/15 SM Next AEX: 03/02/17  Last MMG (if hormonal medication request): 08/20/13 Diagnostic Left BIRADS1 negative Refill authorized: 02/26/15 #90 w/4 refills; discontinued 11/23/15 (change in therapy)

## 2016-03-16 ENCOUNTER — Other Ambulatory Visit: Payer: Self-pay | Admitting: Obstetrics & Gynecology

## 2016-03-16 DIAGNOSIS — Z1231 Encounter for screening mammogram for malignant neoplasm of breast: Secondary | ICD-10-CM

## 2016-03-16 NOTE — Telephone Encounter (Signed)
Spoke with patient. Patient states she is ready to schedule PUS at this time. Appointment scheduled for 03/31/2016 at 1 pm with 1:30 pm consult with Dr.Miller. She is agreeable to date and time. Order previously placed by Dr.Miller. Patient is asking for PUS to be certified again as she has met more of her deductible. Advised I will send a message to the insurance and billing department to have this rechecked. She is agreeable. Also asked about patient's refill request for Oxybutynin. Patient reports she is no longer taking this medication and is taking Lisbeth Ply which was prescribed by Dr.Miller in April. Reports doing well with this medication and does not need refills at this time.  Cc: Theresia Lo  Routing to provider for final review. Patient agreeable to disposition. Will close encounter.

## 2016-03-16 NOTE — Telephone Encounter (Signed)
Patient wants to schedule a follow up ultrasound.

## 2016-03-22 DIAGNOSIS — I10 Essential (primary) hypertension: Secondary | ICD-10-CM | POA: Diagnosis not present

## 2016-03-22 DIAGNOSIS — Z23 Encounter for immunization: Secondary | ICD-10-CM | POA: Diagnosis not present

## 2016-03-22 DIAGNOSIS — J45909 Unspecified asthma, uncomplicated: Secondary | ICD-10-CM | POA: Diagnosis not present

## 2016-03-22 DIAGNOSIS — E559 Vitamin D deficiency, unspecified: Secondary | ICD-10-CM | POA: Diagnosis not present

## 2016-03-28 ENCOUNTER — Ambulatory Visit
Admission: RE | Admit: 2016-03-28 | Discharge: 2016-03-28 | Disposition: A | Discharge status: Home / Self Care | Payer: BLUE CROSS/BLUE SHIELD | Source: Ambulatory Visit | Attending: Obstetrics & Gynecology | Admitting: Obstetrics & Gynecology

## 2016-03-28 DIAGNOSIS — Z1231 Encounter for screening mammogram for malignant neoplasm of breast: Secondary | ICD-10-CM | POA: Diagnosis not present

## 2016-03-28 DIAGNOSIS — H04123 Dry eye syndrome of bilateral lacrimal glands: Secondary | ICD-10-CM | POA: Diagnosis not present

## 2016-03-31 ENCOUNTER — Ambulatory Visit (INDEPENDENT_AMBULATORY_CARE_PROVIDER_SITE_OTHER): Payer: BLUE CROSS/BLUE SHIELD | Admitting: Obstetrics & Gynecology

## 2016-03-31 ENCOUNTER — Ambulatory Visit (INDEPENDENT_AMBULATORY_CARE_PROVIDER_SITE_OTHER): Payer: BLUE CROSS/BLUE SHIELD

## 2016-03-31 ENCOUNTER — Encounter: Payer: Self-pay | Admitting: Obstetrics & Gynecology

## 2016-03-31 VITALS — BP 120/70 | HR 70 | Resp 16 | Ht 66.0 in | Wt 317.0 lb

## 2016-03-31 DIAGNOSIS — N921 Excessive and frequent menstruation with irregular cycle: Secondary | ICD-10-CM

## 2016-03-31 DIAGNOSIS — D252 Subserosal leiomyoma of uterus: Secondary | ICD-10-CM | POA: Insufficient documentation

## 2016-03-31 DIAGNOSIS — N912 Amenorrhea, unspecified: Secondary | ICD-10-CM

## 2016-03-31 LAB — FOLLICLE STIMULATING HORMONE: FSH: 1.9 m[IU]/mL

## 2016-03-31 NOTE — Progress Notes (Signed)
50 y.o. [id number] Married Caucasian female here for pelvic ultrasound due to change in menstrual cycles.  Over past several months, pt has been having two light cycles followed by a heavy cycle.  She had prior PUS showing adenomyosis but no clear fibroids.  Reports she has, now, not cycled since 11/30/15.  Denies hot flashes or night sweats.  Doesn't really "feel" menopausal.  Patient's last menstrual period was 11/30/2015.  Contraception: vasectomy  Findings:  UTERUS: 13.5 x 10.7 x 8.0cm uterus with 6.4 x 6.1cm uterine fibroid, subserosal EMS: 9.23mm ADNEXA: Left ovary: 3.0 x 1.7 x 1.7cm       Right ovary: 2.8 x 2.0 x 1.6cm CUL DE SAC: no free fluid  Discussion:  Findings reviewed with pt.  Body habitus does interfere with quality imagines.  Difficult to tell if there are other fibroids.  However, this does clearly look like a fibroid today.  Will check FSH.  If this is elevated, pt needs to return for endometrial biopsy and then possible provera challenge to thin endometrium.  If Jasper is not elevated, will then just proceed with provera due to length of time since last cycle.  Pt understands plan.  Questions answered.  Pt also reports episode last week of upper abdominal pain that occurred after eating.  Felt abdomen get very hard and firm.  Was very uncomfortable.  Gradually improved.  Her concern with this is possible metastatic ovarian cancer.  Reassured pt this is unlikely, even with difficulty imaging ovaries, as they both appear very small and without masses/cysts.    Pt knows to call if this occurs again as she may need additional evaluation or imaging. Assessment:  6cm uterine fibroid H/O menorrhagia with improved cycles the past year Now amenorrhea since 11/30/15  Plan:  [facility] today.  If low, will need provera challenge If elevated, will need to return for PUS.  ~20 minutes spent with patient >50% of time was in face to face discussion of above.

## 2016-04-07 ENCOUNTER — Telehealth: Payer: Self-pay

## 2016-04-07 DIAGNOSIS — H04123 Dry eye syndrome of bilateral lacrimal glands: Secondary | ICD-10-CM | POA: Diagnosis not present

## 2016-04-07 DIAGNOSIS — H5319 Other subjective visual disturbances: Secondary | ICD-10-CM | POA: Diagnosis not present

## 2016-04-07 NOTE — Telephone Encounter (Signed)
I would like to do an endometrial biopsy please, if possible.

## 2016-04-07 NOTE — Telephone Encounter (Signed)
-----   Message from Megan Salon, MD sent at 04/01/2016  5:27 PM EDT ----- Please let pt know her [facility] is low.  She is not in menopause.  She needs to take Provera 10mg  x 10 days to trigger a cycle.  Depending on how heavy the cycle it, she may need a biopsy as well.  Please have her call and give report on how heavy her bleeding is once it starts. Thanks.

## 2016-04-07 NOTE — Telephone Encounter (Signed)
Spoke with patient. Advised of results as seen below from Dr. Sabra Heck. Patient reports she started her menses on 04/04/16. States her bleeding is heavier than usual. Reports changing her pad every 1 1/2-2hrs. Reports increased cramping and pelvic discomfort. Taking Aleve, which makes the discomfort "bearable". Advised patient I will review with Dr. Sabra Heck and return call with further recommendations as further evaluation may be needed with endometrial biopsy. Patient is agreeable.   Dr. Sabra Heck, would you like patient to be seen for endometrial biopsy at this time? Or to monitor cycles for 3 months and return call if she has missed 3 consecutive cycles?

## 2016-04-07 NOTE — Telephone Encounter (Signed)
Left message to call Arissa Fagin at [phone]. 

## 2016-04-08 ENCOUNTER — Other Ambulatory Visit: Payer: Self-pay | Admitting: Obstetrics & Gynecology

## 2016-04-08 NOTE — Telephone Encounter (Signed)
Medication refill request: Vitamin D Last AEX:  11/20/15 SM Next AEX: 03/02/17 SM Last MMG (if hormonal medication request): 03/28/16 BIRADS1 Refill authorized: 11/20/15 #12 4R. Please advise. Thank you.

## 2016-04-11 DIAGNOSIS — R51 Headache: Secondary | ICD-10-CM | POA: Diagnosis not present

## 2016-04-12 ENCOUNTER — Other Ambulatory Visit: Payer: Self-pay | Admitting: Family Medicine

## 2016-04-12 DIAGNOSIS — R51 Headache: Principal | ICD-10-CM

## 2016-04-12 DIAGNOSIS — R519 Headache, unspecified: Secondary | ICD-10-CM

## 2016-04-18 NOTE — Telephone Encounter (Signed)
Left message to call Cola Gane at [phone]. 

## 2016-04-22 NOTE — Telephone Encounter (Signed)
Yes, ok to send letter

## 2016-04-22 NOTE — Telephone Encounter (Signed)
Dr.Miller, I have been attempted to reach this patient x 2 with no return call. Would you like me to send a letter at this time?

## 2016-04-25 NOTE — Telephone Encounter (Signed)
Letter sent to patient's home address on file requesting return call to discuss further recommendations for care.

## 2016-06-07 DIAGNOSIS — M25512 Pain in left shoulder: Secondary | ICD-10-CM | POA: Diagnosis not present

## 2016-06-21 ENCOUNTER — Other Ambulatory Visit: Payer: Self-pay | Admitting: Orthopedic Surgery

## 2016-06-21 DIAGNOSIS — M25512 Pain in left shoulder: Secondary | ICD-10-CM

## 2016-07-02 ENCOUNTER — Ambulatory Visit
Admission: RE | Admit: 2016-07-02 | Discharge: 2016-07-02 | Disposition: A | Discharge status: Home / Self Care | Payer: BLUE CROSS/BLUE SHIELD | Source: Ambulatory Visit | Attending: Orthopedic Surgery | Admitting: Orthopedic Surgery

## 2016-07-02 DIAGNOSIS — M25512 Pain in left shoulder: Secondary | ICD-10-CM

## 2016-07-02 DIAGNOSIS — M19012 Primary osteoarthritis, left shoulder: Secondary | ICD-10-CM | POA: Diagnosis not present

## 2016-07-04 DIAGNOSIS — M25512 Pain in left shoulder: Secondary | ICD-10-CM | POA: Diagnosis not present

## 2016-07-05 DIAGNOSIS — M7502 Adhesive capsulitis of left shoulder: Secondary | ICD-10-CM | POA: Diagnosis not present

## 2016-07-05 DIAGNOSIS — R531 Weakness: Secondary | ICD-10-CM | POA: Diagnosis not present

## 2016-07-05 DIAGNOSIS — M25512 Pain in left shoulder: Secondary | ICD-10-CM | POA: Diagnosis not present

## 2016-07-08 DIAGNOSIS — M7502 Adhesive capsulitis of left shoulder: Secondary | ICD-10-CM | POA: Diagnosis not present

## 2016-07-08 DIAGNOSIS — M25512 Pain in left shoulder: Secondary | ICD-10-CM | POA: Diagnosis not present

## 2016-07-08 DIAGNOSIS — R531 Weakness: Secondary | ICD-10-CM | POA: Diagnosis not present

## 2016-07-14 DIAGNOSIS — R531 Weakness: Secondary | ICD-10-CM | POA: Diagnosis not present

## 2016-07-14 DIAGNOSIS — M7502 Adhesive capsulitis of left shoulder: Secondary | ICD-10-CM | POA: Diagnosis not present

## 2016-07-14 DIAGNOSIS — M25512 Pain in left shoulder: Secondary | ICD-10-CM | POA: Diagnosis not present

## 2016-07-15 ENCOUNTER — Other Ambulatory Visit: Payer: Self-pay | Admitting: Obstetrics & Gynecology

## 2016-07-15 NOTE — Telephone Encounter (Signed)
Medication refill request: Vitamin D Last AEX:  11/20/15 SM Next AEX: 03/02/17 SM Last MMG (if hormonal medication request): 03/28/16 BIRADS1 Refill authorized: 04/08/16 #24 0R. Please advise. Thank you.

## 2016-07-19 DIAGNOSIS — R531 Weakness: Secondary | ICD-10-CM | POA: Diagnosis not present

## 2016-07-19 DIAGNOSIS — M25512 Pain in left shoulder: Secondary | ICD-10-CM | POA: Diagnosis not present

## 2016-07-19 DIAGNOSIS — M7502 Adhesive capsulitis of left shoulder: Secondary | ICD-10-CM | POA: Diagnosis not present

## 2016-07-22 DIAGNOSIS — M25512 Pain in left shoulder: Secondary | ICD-10-CM | POA: Diagnosis not present

## 2016-07-22 DIAGNOSIS — M7502 Adhesive capsulitis of left shoulder: Secondary | ICD-10-CM | POA: Diagnosis not present

## 2016-07-22 DIAGNOSIS — R531 Weakness: Secondary | ICD-10-CM | POA: Diagnosis not present

## 2016-07-26 DIAGNOSIS — M25512 Pain in left shoulder: Secondary | ICD-10-CM | POA: Diagnosis not present

## 2016-07-26 DIAGNOSIS — R531 Weakness: Secondary | ICD-10-CM | POA: Diagnosis not present

## 2016-07-26 DIAGNOSIS — M7502 Adhesive capsulitis of left shoulder: Secondary | ICD-10-CM | POA: Diagnosis not present

## 2016-07-28 DIAGNOSIS — M7502 Adhesive capsulitis of left shoulder: Secondary | ICD-10-CM | POA: Diagnosis not present

## 2016-07-28 DIAGNOSIS — M25512 Pain in left shoulder: Secondary | ICD-10-CM | POA: Diagnosis not present

## 2016-07-28 DIAGNOSIS — R531 Weakness: Secondary | ICD-10-CM | POA: Diagnosis not present

## 2016-08-03 DIAGNOSIS — M25512 Pain in left shoulder: Secondary | ICD-10-CM | POA: Diagnosis not present

## 2016-08-03 DIAGNOSIS — M7502 Adhesive capsulitis of left shoulder: Secondary | ICD-10-CM | POA: Diagnosis not present

## 2016-08-03 DIAGNOSIS — R531 Weakness: Secondary | ICD-10-CM | POA: Diagnosis not present

## 2016-08-05 DIAGNOSIS — M7502 Adhesive capsulitis of left shoulder: Secondary | ICD-10-CM | POA: Diagnosis not present

## 2016-08-05 DIAGNOSIS — M25512 Pain in left shoulder: Secondary | ICD-10-CM | POA: Diagnosis not present

## 2016-08-05 DIAGNOSIS — R531 Weakness: Secondary | ICD-10-CM | POA: Diagnosis not present

## 2016-08-09 DIAGNOSIS — R531 Weakness: Secondary | ICD-10-CM | POA: Diagnosis not present

## 2016-08-09 DIAGNOSIS — M7502 Adhesive capsulitis of left shoulder: Secondary | ICD-10-CM | POA: Diagnosis not present

## 2016-08-09 DIAGNOSIS — M25512 Pain in left shoulder: Secondary | ICD-10-CM | POA: Diagnosis not present

## 2016-08-11 DIAGNOSIS — R531 Weakness: Secondary | ICD-10-CM | POA: Diagnosis not present

## 2016-08-11 DIAGNOSIS — M7502 Adhesive capsulitis of left shoulder: Secondary | ICD-10-CM | POA: Diagnosis not present

## 2016-08-11 DIAGNOSIS — M25512 Pain in left shoulder: Secondary | ICD-10-CM | POA: Diagnosis not present

## 2016-08-16 DIAGNOSIS — R531 Weakness: Secondary | ICD-10-CM | POA: Diagnosis not present

## 2016-08-16 DIAGNOSIS — M25512 Pain in left shoulder: Secondary | ICD-10-CM | POA: Diagnosis not present

## 2016-08-16 DIAGNOSIS — M7502 Adhesive capsulitis of left shoulder: Secondary | ICD-10-CM | POA: Diagnosis not present

## 2016-08-18 DIAGNOSIS — M25512 Pain in left shoulder: Secondary | ICD-10-CM | POA: Diagnosis not present

## 2016-08-18 DIAGNOSIS — R531 Weakness: Secondary | ICD-10-CM | POA: Diagnosis not present

## 2016-08-18 DIAGNOSIS — M7502 Adhesive capsulitis of left shoulder: Secondary | ICD-10-CM | POA: Diagnosis not present

## 2016-08-23 DIAGNOSIS — M7502 Adhesive capsulitis of left shoulder: Secondary | ICD-10-CM | POA: Diagnosis not present

## 2016-08-23 DIAGNOSIS — R531 Weakness: Secondary | ICD-10-CM | POA: Diagnosis not present

## 2016-08-23 DIAGNOSIS — M25512 Pain in left shoulder: Secondary | ICD-10-CM | POA: Diagnosis not present

## 2016-08-25 DIAGNOSIS — M7502 Adhesive capsulitis of left shoulder: Secondary | ICD-10-CM | POA: Diagnosis not present

## 2016-08-25 DIAGNOSIS — R531 Weakness: Secondary | ICD-10-CM | POA: Diagnosis not present

## 2016-08-25 DIAGNOSIS — M25512 Pain in left shoulder: Secondary | ICD-10-CM | POA: Diagnosis not present

## 2016-08-30 DIAGNOSIS — R531 Weakness: Secondary | ICD-10-CM | POA: Diagnosis not present

## 2016-08-30 DIAGNOSIS — M25512 Pain in left shoulder: Secondary | ICD-10-CM | POA: Diagnosis not present

## 2016-08-30 DIAGNOSIS — J45909 Unspecified asthma, uncomplicated: Secondary | ICD-10-CM | POA: Diagnosis not present

## 2016-08-30 DIAGNOSIS — E559 Vitamin D deficiency, unspecified: Secondary | ICD-10-CM | POA: Diagnosis not present

## 2016-08-30 DIAGNOSIS — I10 Essential (primary) hypertension: Secondary | ICD-10-CM | POA: Diagnosis not present

## 2016-08-30 DIAGNOSIS — Z79899 Other long term (current) drug therapy: Secondary | ICD-10-CM | POA: Diagnosis not present

## 2016-08-30 DIAGNOSIS — Z13220 Encounter for screening for lipoid disorders: Secondary | ICD-10-CM | POA: Diagnosis not present

## 2016-08-30 DIAGNOSIS — M7502 Adhesive capsulitis of left shoulder: Secondary | ICD-10-CM | POA: Diagnosis not present

## 2016-09-01 DIAGNOSIS — M7502 Adhesive capsulitis of left shoulder: Secondary | ICD-10-CM | POA: Diagnosis not present

## 2016-09-01 DIAGNOSIS — M25512 Pain in left shoulder: Secondary | ICD-10-CM | POA: Diagnosis not present

## 2016-09-01 DIAGNOSIS — R531 Weakness: Secondary | ICD-10-CM | POA: Diagnosis not present

## 2016-09-06 DIAGNOSIS — M25512 Pain in left shoulder: Secondary | ICD-10-CM | POA: Diagnosis not present

## 2016-09-06 DIAGNOSIS — R531 Weakness: Secondary | ICD-10-CM | POA: Diagnosis not present

## 2016-09-06 DIAGNOSIS — M7502 Adhesive capsulitis of left shoulder: Secondary | ICD-10-CM | POA: Diagnosis not present

## 2016-09-08 DIAGNOSIS — M25512 Pain in left shoulder: Secondary | ICD-10-CM | POA: Diagnosis not present

## 2016-09-08 DIAGNOSIS — M7502 Adhesive capsulitis of left shoulder: Secondary | ICD-10-CM | POA: Diagnosis not present

## 2016-09-08 DIAGNOSIS — R531 Weakness: Secondary | ICD-10-CM | POA: Diagnosis not present

## 2016-09-13 DIAGNOSIS — M25512 Pain in left shoulder: Secondary | ICD-10-CM | POA: Diagnosis not present

## 2016-09-13 DIAGNOSIS — R531 Weakness: Secondary | ICD-10-CM | POA: Diagnosis not present

## 2016-09-13 DIAGNOSIS — M7502 Adhesive capsulitis of left shoulder: Secondary | ICD-10-CM | POA: Diagnosis not present

## 2016-09-15 DIAGNOSIS — R531 Weakness: Secondary | ICD-10-CM | POA: Diagnosis not present

## 2016-09-15 DIAGNOSIS — M7502 Adhesive capsulitis of left shoulder: Secondary | ICD-10-CM | POA: Diagnosis not present

## 2016-09-15 DIAGNOSIS — M25512 Pain in left shoulder: Secondary | ICD-10-CM | POA: Diagnosis not present

## 2016-09-20 DIAGNOSIS — M25512 Pain in left shoulder: Secondary | ICD-10-CM | POA: Diagnosis not present

## 2016-09-20 DIAGNOSIS — R531 Weakness: Secondary | ICD-10-CM | POA: Diagnosis not present

## 2016-09-20 DIAGNOSIS — M7502 Adhesive capsulitis of left shoulder: Secondary | ICD-10-CM | POA: Diagnosis not present

## 2016-09-22 DIAGNOSIS — M7502 Adhesive capsulitis of left shoulder: Secondary | ICD-10-CM | POA: Diagnosis not present

## 2016-09-22 DIAGNOSIS — R531 Weakness: Secondary | ICD-10-CM | POA: Diagnosis not present

## 2016-09-22 DIAGNOSIS — M25512 Pain in left shoulder: Secondary | ICD-10-CM | POA: Diagnosis not present

## 2016-09-27 DIAGNOSIS — M25512 Pain in left shoulder: Secondary | ICD-10-CM | POA: Diagnosis not present

## 2016-09-27 DIAGNOSIS — R531 Weakness: Secondary | ICD-10-CM | POA: Diagnosis not present

## 2016-09-27 DIAGNOSIS — M7502 Adhesive capsulitis of left shoulder: Secondary | ICD-10-CM | POA: Diagnosis not present

## 2016-09-29 DIAGNOSIS — M7502 Adhesive capsulitis of left shoulder: Secondary | ICD-10-CM | POA: Diagnosis not present

## 2016-09-29 DIAGNOSIS — R531 Weakness: Secondary | ICD-10-CM | POA: Diagnosis not present

## 2016-09-29 DIAGNOSIS — M25512 Pain in left shoulder: Secondary | ICD-10-CM | POA: Diagnosis not present

## 2016-10-04 DIAGNOSIS — M7502 Adhesive capsulitis of left shoulder: Secondary | ICD-10-CM | POA: Diagnosis not present

## 2016-10-04 DIAGNOSIS — M25512 Pain in left shoulder: Secondary | ICD-10-CM | POA: Diagnosis not present

## 2016-10-04 DIAGNOSIS — R531 Weakness: Secondary | ICD-10-CM | POA: Diagnosis not present

## 2016-10-06 DIAGNOSIS — R531 Weakness: Secondary | ICD-10-CM | POA: Diagnosis not present

## 2016-10-06 DIAGNOSIS — M25512 Pain in left shoulder: Secondary | ICD-10-CM | POA: Diagnosis not present

## 2016-10-06 DIAGNOSIS — M7502 Adhesive capsulitis of left shoulder: Secondary | ICD-10-CM | POA: Diagnosis not present

## 2016-10-11 DIAGNOSIS — M7502 Adhesive capsulitis of left shoulder: Secondary | ICD-10-CM | POA: Diagnosis not present

## 2016-10-11 DIAGNOSIS — R531 Weakness: Secondary | ICD-10-CM | POA: Diagnosis not present

## 2016-10-11 DIAGNOSIS — M25512 Pain in left shoulder: Secondary | ICD-10-CM | POA: Diagnosis not present

## 2016-10-13 DIAGNOSIS — R531 Weakness: Secondary | ICD-10-CM | POA: Diagnosis not present

## 2016-10-13 DIAGNOSIS — M25512 Pain in left shoulder: Secondary | ICD-10-CM | POA: Diagnosis not present

## 2016-10-13 DIAGNOSIS — M7502 Adhesive capsulitis of left shoulder: Secondary | ICD-10-CM | POA: Diagnosis not present

## 2016-10-25 DIAGNOSIS — R531 Weakness: Secondary | ICD-10-CM | POA: Diagnosis not present

## 2016-10-25 DIAGNOSIS — M7502 Adhesive capsulitis of left shoulder: Secondary | ICD-10-CM | POA: Diagnosis not present

## 2016-10-25 DIAGNOSIS — M25512 Pain in left shoulder: Secondary | ICD-10-CM | POA: Diagnosis not present

## 2016-10-27 DIAGNOSIS — M7502 Adhesive capsulitis of left shoulder: Secondary | ICD-10-CM | POA: Diagnosis not present

## 2016-10-27 DIAGNOSIS — R531 Weakness: Secondary | ICD-10-CM | POA: Diagnosis not present

## 2016-10-27 DIAGNOSIS — M25512 Pain in left shoulder: Secondary | ICD-10-CM | POA: Diagnosis not present

## 2016-11-01 DIAGNOSIS — M7502 Adhesive capsulitis of left shoulder: Secondary | ICD-10-CM | POA: Diagnosis not present

## 2016-11-01 DIAGNOSIS — M25512 Pain in left shoulder: Secondary | ICD-10-CM | POA: Diagnosis not present

## 2016-11-01 DIAGNOSIS — R531 Weakness: Secondary | ICD-10-CM | POA: Diagnosis not present

## 2016-11-03 DIAGNOSIS — R531 Weakness: Secondary | ICD-10-CM | POA: Diagnosis not present

## 2016-11-03 DIAGNOSIS — M25512 Pain in left shoulder: Secondary | ICD-10-CM | POA: Diagnosis not present

## 2016-11-03 DIAGNOSIS — M7502 Adhesive capsulitis of left shoulder: Secondary | ICD-10-CM | POA: Diagnosis not present

## 2016-11-08 DIAGNOSIS — R531 Weakness: Secondary | ICD-10-CM | POA: Diagnosis not present

## 2016-11-08 DIAGNOSIS — M7502 Adhesive capsulitis of left shoulder: Secondary | ICD-10-CM | POA: Diagnosis not present

## 2016-11-08 DIAGNOSIS — M25512 Pain in left shoulder: Secondary | ICD-10-CM | POA: Diagnosis not present

## 2016-11-10 DIAGNOSIS — R531 Weakness: Secondary | ICD-10-CM | POA: Diagnosis not present

## 2016-11-10 DIAGNOSIS — M25512 Pain in left shoulder: Secondary | ICD-10-CM | POA: Diagnosis not present

## 2016-11-10 DIAGNOSIS — M7502 Adhesive capsulitis of left shoulder: Secondary | ICD-10-CM | POA: Diagnosis not present

## 2016-11-15 DIAGNOSIS — R531 Weakness: Secondary | ICD-10-CM | POA: Diagnosis not present

## 2016-11-15 DIAGNOSIS — M25512 Pain in left shoulder: Secondary | ICD-10-CM | POA: Diagnosis not present

## 2016-11-15 DIAGNOSIS — M7502 Adhesive capsulitis of left shoulder: Secondary | ICD-10-CM | POA: Diagnosis not present

## 2016-11-16 DIAGNOSIS — M7989 Other specified soft tissue disorders: Secondary | ICD-10-CM | POA: Diagnosis not present

## 2016-11-16 DIAGNOSIS — R609 Edema, unspecified: Secondary | ICD-10-CM | POA: Diagnosis not present

## 2016-11-17 DIAGNOSIS — M25512 Pain in left shoulder: Secondary | ICD-10-CM | POA: Diagnosis not present

## 2016-11-17 DIAGNOSIS — M7502 Adhesive capsulitis of left shoulder: Secondary | ICD-10-CM | POA: Diagnosis not present

## 2016-11-17 DIAGNOSIS — R531 Weakness: Secondary | ICD-10-CM | POA: Diagnosis not present

## 2016-11-22 DIAGNOSIS — R531 Weakness: Secondary | ICD-10-CM | POA: Diagnosis not present

## 2016-11-22 DIAGNOSIS — M7502 Adhesive capsulitis of left shoulder: Secondary | ICD-10-CM | POA: Diagnosis not present

## 2016-11-22 DIAGNOSIS — M25512 Pain in left shoulder: Secondary | ICD-10-CM | POA: Diagnosis not present

## 2016-11-24 DIAGNOSIS — M25512 Pain in left shoulder: Secondary | ICD-10-CM | POA: Diagnosis not present

## 2016-11-24 DIAGNOSIS — R531 Weakness: Secondary | ICD-10-CM | POA: Diagnosis not present

## 2016-11-24 DIAGNOSIS — M7502 Adhesive capsulitis of left shoulder: Secondary | ICD-10-CM | POA: Diagnosis not present

## 2016-11-29 DIAGNOSIS — M25512 Pain in left shoulder: Secondary | ICD-10-CM | POA: Diagnosis not present

## 2016-11-29 DIAGNOSIS — R531 Weakness: Secondary | ICD-10-CM | POA: Diagnosis not present

## 2016-11-29 DIAGNOSIS — M7502 Adhesive capsulitis of left shoulder: Secondary | ICD-10-CM | POA: Diagnosis not present

## 2016-11-30 ENCOUNTER — Other Ambulatory Visit: Payer: Self-pay | Admitting: Obstetrics and Gynecology

## 2016-11-30 NOTE — Telephone Encounter (Signed)
Medication refill request: Trosium Cholride Last AEX:  11/20/15 SM Next AEX: 03/02/17 SM Last MMG (if hormonal medication request): 03/28/16 BIRADS1, Density [facility] Refill authorized: 11/24/15 #30 11R. Please advise. Thank you.   Routing to BS since SM is out of the office.

## 2016-12-01 DIAGNOSIS — R531 Weakness: Secondary | ICD-10-CM | POA: Diagnosis not present

## 2016-12-01 DIAGNOSIS — M7502 Adhesive capsulitis of left shoulder: Secondary | ICD-10-CM | POA: Diagnosis not present

## 2016-12-01 DIAGNOSIS — M25512 Pain in left shoulder: Secondary | ICD-10-CM | POA: Diagnosis not present

## 2016-12-05 ENCOUNTER — Encounter: Payer: Self-pay | Admitting: Obstetrics & Gynecology

## 2016-12-05 ENCOUNTER — Ambulatory Visit (INDEPENDENT_AMBULATORY_CARE_PROVIDER_SITE_OTHER): Payer: BLUE CROSS/BLUE SHIELD | Admitting: Obstetrics & Gynecology

## 2016-12-05 ENCOUNTER — Other Ambulatory Visit (HOSPITAL_COMMUNITY)
Admission: RE | Admit: 2016-12-05 | Discharge: 2016-12-05 | Disposition: A | Discharge status: Home / Self Care | Payer: BLUE CROSS/BLUE SHIELD | Source: Ambulatory Visit | Attending: Obstetrics & Gynecology | Admitting: Obstetrics & Gynecology

## 2016-12-05 VITALS — BP 136/82 | HR 92 | Resp 16 | Ht 65.5 in | Wt 316.0 lb

## 2016-12-05 DIAGNOSIS — Z01419 Encounter for gynecological examination (general) (routine) without abnormal findings: Secondary | ICD-10-CM | POA: Insufficient documentation

## 2016-12-05 DIAGNOSIS — Z124 Encounter for screening for malignant neoplasm of cervix: Secondary | ICD-10-CM | POA: Diagnosis not present

## 2016-12-05 DIAGNOSIS — N926 Irregular menstruation, unspecified: Secondary | ICD-10-CM | POA: Diagnosis not present

## 2016-12-05 MED ORDER — TROSPIUM CHLORIDE ER 60 MG PO CP24: 1.0000 | ORAL_CAPSULE | Freq: Every day | ORAL | 4 refills | Status: DC

## 2016-12-05 MED ORDER — VITAMIN D (ERGOCALCIFEROL) 1.25 MG (50000 UNIT) PO CAPS
ORAL_CAPSULE | ORAL | 4 refills | Status: DC
Start: 2016-12-05 — End: 2019-05-30

## 2016-12-05 NOTE — Patient Instructions (Signed)
You might want to read about Cologuard.

## 2016-12-05 NOTE — Progress Notes (Signed)
51 y.o. [id number] MarriedCaucasianF here for annual exam.  Expecting second grandchild this year.  Her daughter, who lives in [place], has been married for two years is who is expecting.  Cycles are changing.  Cycles are really light more like spotting.  Last more regular cycle was in December.    Biggest issue right now for her is frozen shoulder on left.  Doing physical therapy and cupping.  Using flexeril, tramadol, and very very rarely marcotis  PCP:  Dr. Drema Dallas.  Med check in February.  Saw PA three weeks ago due to some swelling in legs.  Had blood work related to this.    Patient's last menstrual period was 07/25/2016 (within days).          Sexually active: Yes.    The current method of family planning is vasectomy.    Exercising: No.  Exercise is limited by frozen shoulder. Smoker:  no  Health Maintenance: Pap:  11/20/15 Neg. HR HPV:neg   09/03/13 Neg  History of abnormal Pap:  no MMG:  03/28/16 BIRADS1:neg  Colonoscopy:  none BMD:   Never TDaP:  11/2006, due.  Pt aware .  Pneumonia vaccine(s):  never Zostavax:   never Hep C testing: not indicated  Screening Labs: PCP did labs within the last 3 weeks   reports that she has never smoked. She has never used smokeless tobacco. She reports that she does not drink alcohol or use drugs.  Past Medical History:  Diagnosis Date  . Hypertension   . Lyme disease 5/14  . Menorrhagia with irregular cycle     Past Surgical History:  Procedure Laterality Date  . CESAREAN SECTION    . REPEAT CESAREAN SECTION      Current Outpatient Prescriptions  Medication Sig Dispense Refill  . benzonatate (TESSALON) 100 MG capsule as needed.  1  . cyclobenzaprine (FLEXERIL) 10 MG tablet Take 10 mg by mouth at bedtime.  0  . hydrochlorothiazide (HYDRODIURIL) 25 MG tablet Take 25 mg by mouth daily.  3  . losartan (COZAAR) 100 MG tablet Take 1 tablet (100 mg total) by mouth daily. 90 tablet 4  . meloxicam (MOBIC) 15 MG tablet Take 1 tablet by mouth  daily as needed.  2  . methocarbamol (ROBAXIN) 500 MG tablet Take 500 mg by mouth as needed for muscle spasms.    Marland Kitchen PROAIR HFA 108 (90 Base) MCG/ACT inhaler as needed.  0  . QVAR 40 MCG/ACT inhaler daily.  1  . traMADol (ULTRAM) 50 MG tablet TAKE 1 TO 2 TABLETS BY MOUTH EVERY 6 TO 8 HOURS AS NEEDED FOR PAIN  0  . Trospium Chloride 60 MG CP24 TAKE ONE CAPSULE BY MOUTH EVERY DAY 30 capsule 3  . Vitamin D, Ergocalciferol, (DRISDOL) 50000 units CAPS capsule TAKE ONE CAPSULE BY MOUTH TWICE A WEEK 24 capsule 0   No current facility-administered medications for this visit.     Family History  Problem Relation Age of Onset  . Liver disease Mother     nonalcoholic fatty liver disease  . Hepatitis C Mother   . Cancer Father     lung cancer  . Breast cancer Maternal Grandmother     ROS:  Pertinent items are noted in HPI.  Otherwise, a comprehensive ROS was negative.  Exam:   BP 136/82 (BP Location: Right Arm, Patient Position: Sitting, Cuff Size: Normal)   Pulse 92   Resp 16   Ht 5' 5.5" (1.664 m)   Wt (!) 316 lb (  143.3 kg)   LMP 07/25/2016 (Within Days)   BMI 51.79 kg/m   Weight change: -4#  Height: 5' 5.5" (166.4 cm)  Ht Readings from Last 3 Encounters:  12/05/16 5' 5.5" (1.664 m)  03/31/16 5\' 6"  (1.676 m)  11/20/15 5\' 6"  (1.676 m)    General appearance: alert, cooperative and appears stated age Head: Normocephalic, without obvious abnormality, atraumatic Neck: no adenopathy, supple, symmetrical, trachea midline and thyroid normal to inspection and palpation Lungs: clear to auscultation bilaterally Breasts: normal appearance, no masses or tenderness Heart: regular rate and rhythm Abdomen: soft, non-tender; bowel sounds normal; no masses,  no organomegaly Extremities: extremities normal, atraumatic, no cyanosis or edema Skin: Skin color, texture, turgor normal. No rashes or lesions Lymph nodes: Cervical, supraclavicular, and axillary nodes normal. No abnormal inguinal nodes  palpated Neurologic: Grossly normal   Pelvic: External genitalia:  no lesions              Urethra:  normal appearing urethra with no masses, tenderness or lesions              Bartholins and Skenes: normal                 Vagina: normal appearing vagina with normal color and discharge, no lesions              Cervix: no lesions              Pap taken: Yes.   Bimanual Exam:  Uterus:  normal size, contour, position, consistency, mobility, non-tender              Adnexa: normal adnexa and no mass, fullness, tenderness               Rectovaginal: Confirms               Anus:  normal sphincter tone, no lesions  Chaperone was present for exam.  A:  Well Woman with normal exam Perimenopausal bleeding.  Has ultrasound last year. H/O enlarged uterus without fibroids Hypertension OAB, failed  Morbid obesity Allergies  P:   Mammogram guidelines reviewed.  Pt is UTD. pap smear obtained today.  HR HPV neg 4/17.   Working on weight loss to be under 300# so she does not have to do this in the hospital.  D/W pt Cologuard.  Information given. Pt will return for [facility] and Tdap Additional lab work done with PCP Vit D 50K weekly.  #12/4RF [place] 60mg  daily.  #90/4RF. Return annually or prn

## 2016-12-06 LAB — CYTOLOGY - PAP
Adequacy: ABSENT
Diagnosis: NEGATIVE

## 2016-12-08 ENCOUNTER — Ambulatory Visit: Payer: BLUE CROSS/BLUE SHIELD

## 2016-12-10 ENCOUNTER — Other Ambulatory Visit: Payer: Self-pay | Admitting: Obstetrics & Gynecology

## 2016-12-10 DIAGNOSIS — N915 Oligomenorrhea, unspecified: Secondary | ICD-10-CM

## 2016-12-13 DIAGNOSIS — M7502 Adhesive capsulitis of left shoulder: Secondary | ICD-10-CM | POA: Diagnosis not present

## 2016-12-13 DIAGNOSIS — M25512 Pain in left shoulder: Secondary | ICD-10-CM | POA: Diagnosis not present

## 2016-12-13 DIAGNOSIS — R531 Weakness: Secondary | ICD-10-CM | POA: Diagnosis not present

## 2016-12-15 ENCOUNTER — Ambulatory Visit (INDEPENDENT_AMBULATORY_CARE_PROVIDER_SITE_OTHER): Payer: BLUE CROSS/BLUE SHIELD

## 2016-12-15 ENCOUNTER — Other Ambulatory Visit: Payer: Self-pay

## 2016-12-15 VITALS — BP 130/92 | HR 80 | Resp 16 | Ht 65.5 in | Wt 320.0 lb

## 2016-12-15 DIAGNOSIS — Z23 Encounter for immunization: Secondary | ICD-10-CM | POA: Diagnosis not present

## 2016-12-15 DIAGNOSIS — N915 Oligomenorrhea, unspecified: Secondary | ICD-10-CM | POA: Diagnosis not present

## 2016-12-15 DIAGNOSIS — M7502 Adhesive capsulitis of left shoulder: Secondary | ICD-10-CM | POA: Diagnosis not present

## 2016-12-15 DIAGNOSIS — M25512 Pain in left shoulder: Secondary | ICD-10-CM | POA: Diagnosis not present

## 2016-12-15 DIAGNOSIS — R531 Weakness: Secondary | ICD-10-CM | POA: Diagnosis not present

## 2016-12-15 NOTE — Addendum Note (Signed)
Addended by: Zoila Shutter D on: 12/15/2016 02:42 PM   Modules accepted: Orders

## 2016-12-15 NOTE — Progress Notes (Signed)
Patient here for labs and tetanus injection.  Patient feels good today with no complaints.  Tetanus injection given in R Deltoid.   Routed to provider and encounter closed.

## 2016-12-16 LAB — FOLLICLE STIMULATING HORMONE: FSH: 3.8 m[IU]/mL

## 2016-12-20 DIAGNOSIS — R531 Weakness: Secondary | ICD-10-CM | POA: Diagnosis not present

## 2016-12-20 DIAGNOSIS — M25512 Pain in left shoulder: Secondary | ICD-10-CM | POA: Diagnosis not present

## 2016-12-20 DIAGNOSIS — M7502 Adhesive capsulitis of left shoulder: Secondary | ICD-10-CM | POA: Diagnosis not present

## 2016-12-22 ENCOUNTER — Telehealth: Payer: Self-pay | Admitting: *Deleted

## 2016-12-22 DIAGNOSIS — M7502 Adhesive capsulitis of left shoulder: Secondary | ICD-10-CM | POA: Diagnosis not present

## 2016-12-22 DIAGNOSIS — R531 Weakness: Secondary | ICD-10-CM | POA: Diagnosis not present

## 2016-12-22 DIAGNOSIS — M25512 Pain in left shoulder: Secondary | ICD-10-CM | POA: Diagnosis not present

## 2016-12-22 NOTE — Telephone Encounter (Signed)
-----   Message from Megan Salon, MD sent at 12/21/2016 12:43 PM EDT ----- Please let her know her [facility] is still in premenopausal range.  Cycles are better.  I would like her to call if she has any particular heavy or irregular bleeding. Thanks.

## 2016-12-22 NOTE — Telephone Encounter (Signed)
Spoke with patient, advised of results and recommendations as seen below per Dr. Sabra Heck. Patient verbalizes understanding and is agreeable.  Routing to provider for final review. Patient is agreeable to disposition. Will close encounter.

## 2016-12-22 NOTE — Telephone Encounter (Signed)
Left message to call Manly Nestle at [phone].  

## 2016-12-27 DIAGNOSIS — M25512 Pain in left shoulder: Secondary | ICD-10-CM | POA: Diagnosis not present

## 2016-12-27 DIAGNOSIS — M7502 Adhesive capsulitis of left shoulder: Secondary | ICD-10-CM | POA: Diagnosis not present

## 2016-12-27 DIAGNOSIS — R531 Weakness: Secondary | ICD-10-CM | POA: Diagnosis not present

## 2016-12-29 DIAGNOSIS — M25512 Pain in left shoulder: Secondary | ICD-10-CM | POA: Diagnosis not present

## 2016-12-29 DIAGNOSIS — M7502 Adhesive capsulitis of left shoulder: Secondary | ICD-10-CM | POA: Diagnosis not present

## 2016-12-29 DIAGNOSIS — R531 Weakness: Secondary | ICD-10-CM | POA: Diagnosis not present

## 2017-01-31 DIAGNOSIS — S86911A Strain of unspecified muscle(s) and tendon(s) at lower leg level, right leg, initial encounter: Secondary | ICD-10-CM | POA: Diagnosis not present

## 2017-01-31 DIAGNOSIS — M79661 Pain in right lower leg: Secondary | ICD-10-CM | POA: Diagnosis not present

## 2017-03-02 ENCOUNTER — Ambulatory Visit: Payer: BLUE CROSS/BLUE SHIELD | Admitting: Obstetrics & Gynecology

## 2017-05-12 DIAGNOSIS — I10 Essential (primary) hypertension: Secondary | ICD-10-CM | POA: Diagnosis not present

## 2017-05-12 DIAGNOSIS — J0100 Acute maxillary sinusitis, unspecified: Secondary | ICD-10-CM | POA: Diagnosis not present

## 2017-05-12 DIAGNOSIS — J209 Acute bronchitis, unspecified: Secondary | ICD-10-CM | POA: Diagnosis not present

## 2017-05-12 DIAGNOSIS — R05 Cough: Secondary | ICD-10-CM | POA: Diagnosis not present

## 2017-06-06 DIAGNOSIS — Z23 Encounter for immunization: Secondary | ICD-10-CM | POA: Diagnosis not present

## 2017-06-06 DIAGNOSIS — G479 Sleep disorder, unspecified: Secondary | ICD-10-CM | POA: Diagnosis not present

## 2017-07-17 DIAGNOSIS — R0681 Apnea, not elsewhere classified: Secondary | ICD-10-CM | POA: Diagnosis not present

## 2017-07-17 DIAGNOSIS — R0683 Snoring: Secondary | ICD-10-CM | POA: Diagnosis not present

## 2017-07-17 DIAGNOSIS — G478 Other sleep disorders: Secondary | ICD-10-CM | POA: Diagnosis not present

## 2017-07-17 DIAGNOSIS — G4719 Other hypersomnia: Secondary | ICD-10-CM | POA: Diagnosis not present

## 2017-07-31 DIAGNOSIS — G4733 Obstructive sleep apnea (adult) (pediatric): Secondary | ICD-10-CM | POA: Diagnosis not present

## 2017-08-08 DIAGNOSIS — J0100 Acute maxillary sinusitis, unspecified: Secondary | ICD-10-CM | POA: Diagnosis not present

## 2017-08-08 DIAGNOSIS — G4733 Obstructive sleep apnea (adult) (pediatric): Secondary | ICD-10-CM | POA: Diagnosis not present

## 2017-08-08 DIAGNOSIS — J209 Acute bronchitis, unspecified: Secondary | ICD-10-CM | POA: Diagnosis not present

## 2017-08-08 DIAGNOSIS — R05 Cough: Secondary | ICD-10-CM | POA: Diagnosis not present

## 2017-08-11 ENCOUNTER — Telehealth: Payer: Self-pay | Admitting: Obstetrics & Gynecology

## 2017-08-11 NOTE — Telephone Encounter (Signed)
Spoke with patient. Patient is going to check with insurance provider for comparable covered alternatives for Trospium Chloride, if available, and return call.

## 2017-08-11 NOTE — Telephone Encounter (Signed)
Patient called and requested an alternate prescription for her bladder medication. She said her current medication has tripled in cost and she needs a lower cost alternative.   [facility] [employer]

## 2017-08-17 NOTE — Telephone Encounter (Signed)
Spoke with patient, requesting comparable alternative for trospium chloride. Patient states medication is covered, out of pocket cost too expensive. Advised patient will review with Dr. Sabra Heck and return call with recommendations. Advised patient Dr. Sabra Heck is seeing patients, response may not be immediate. Patient is agreeable.    Dr. Sabra Heck- please advise on alternative RX?

## 2017-08-21 ENCOUNTER — Other Ambulatory Visit: Payer: Self-pay | Admitting: Obstetrics & Gynecology

## 2017-08-21 NOTE — Telephone Encounter (Signed)
I cannot tell when entering a prescription for her which options will be less expensive.  Do you have access to her formulary or does she?

## 2017-08-22 NOTE — Telephone Encounter (Signed)
Spoke with Katelyn Cohen, was unable to provide formulary list for member. Member can call member services and request formulary list at [phone].   Call returned to patient, no answer, left detailed message. Advised patient she would need to call [employer] member services and request formulary list. May return call to office to provide alternatives, may fax list to office at [phone] ATTN: Lawson Radar. Return call with any additional questions.

## 2017-09-08 DIAGNOSIS — G4733 Obstructive sleep apnea (adult) (pediatric): Secondary | ICD-10-CM | POA: Diagnosis not present

## 2017-09-11 DIAGNOSIS — J209 Acute bronchitis, unspecified: Secondary | ICD-10-CM | POA: Diagnosis not present

## 2017-09-11 DIAGNOSIS — J0100 Acute maxillary sinusitis, unspecified: Secondary | ICD-10-CM | POA: Diagnosis not present

## 2017-09-11 DIAGNOSIS — R05 Cough: Secondary | ICD-10-CM | POA: Diagnosis not present

## 2017-09-11 DIAGNOSIS — G4733 Obstructive sleep apnea (adult) (pediatric): Secondary | ICD-10-CM | POA: Diagnosis not present

## 2017-09-19 DIAGNOSIS — J45909 Unspecified asthma, uncomplicated: Secondary | ICD-10-CM | POA: Diagnosis not present

## 2017-09-19 DIAGNOSIS — K429 Umbilical hernia without obstruction or gangrene: Secondary | ICD-10-CM | POA: Diagnosis not present

## 2017-09-19 DIAGNOSIS — S46911A Strain of unspecified muscle, fascia and tendon at shoulder and upper arm level, right arm, initial encounter: Secondary | ICD-10-CM | POA: Diagnosis not present

## 2017-09-20 NOTE — Telephone Encounter (Signed)
Dr. Sabra Heck, patient has not returned call, left detailed message previously, ok to close encounter?

## 2017-09-21 ENCOUNTER — Ambulatory Visit: Payer: Self-pay | Admitting: General Surgery

## 2017-09-21 DIAGNOSIS — K429 Umbilical hernia without obstruction or gangrene: Secondary | ICD-10-CM | POA: Diagnosis not present

## 2017-09-21 NOTE — H&P (Signed)
History of Present Illness Ralene Ok MD; 09/21/2017 11:09 AM) The patient is a 52 year old female who presents with an umbilical hernia. Referred by: Marda Stalker- [facility] Chief Complaint: Umbilical hernia  Patient is a 52 year old female who comes in with a 3 to four-month history of an umbilical hernia. She states that she's had some issues with some incarceration however no strangulation. Patient states that she does have discomfort and pain when active. She states that she has been able to reduce the hernia when laying down. She states that she has had no previous abdominal surgery in the past, suffers from C-section.  Patient is currently working on her shoulder with physical therapy, in the pool.   Past Surgical History Alean Rinne, [place]; 09/21/2017 10:56 AM) Cesarean Section - Multiple  Oral Surgery   Diagnostic Studies History Alean Rinne, [place]; 09/21/2017 10:56 AM) Colonoscopy  never Mammogram  1-3 years ago Pap Smear  1-5 years ago  Allergies Alean Rinne, RMA; 09/21/2017 10:57 AM) Penicillins  Allergies Reconciled   Medication History Alean Rinne, RMA; 09/21/2017 10:58 AM) TraMADol HCl (50MG  Tablet, Oral) Active. Losartan Potassium (100MG  Tablet, Oral) Active. Diclofenac Sodium (1% Gel, Transdermal) Active. HydroCHLOROthiazide (25MG  Tablet, Oral) Active. Qvar RediHaler (40MCG/ACT Aero Breath Act, Inhalation) Active. Methocarbamol (500MG  Tablet, Oral) Active. Trospium Chloride ER (60MG  Capsule ER 24HR, Oral) Active. Oseltamivir Phosphate (75MG  Capsule, Oral) Active. Medications Reconciled  Social History Alean Rinne, [place]; 09/21/2017 10:56 AM) No alcohol use  No caffeine use  No drug use  Tobacco use  Never smoker.  Family History Alean Rinne, [place]; 09/21/2017 10:56 AM) Alcohol Abuse  Family Members In General, Sister. Arthritis  Father, Mother, Sister. Bleeding disorder  Sister. Breast Cancer  Family Members In  General. Cancer  Father. Colon Cancer  Father. Depression  Daughter, Sister. Hypertension  Father, Mother. Migraine Headache  Daughter, Sister. Ovarian Cancer  Family Members In General. Rectal Cancer  Family Members In General. Respiratory Condition  Father. Thyroid problems  Father, Mother.  Pregnancy / Birth History Alean Rinne, [place]; 09/21/2017 10:56 AM) Age at menarche  29 years. Contraceptive History  Oral contraceptives. Gravida  4 Irregular periods  Length (months) of breastfeeding  7-12 Maternal age  65-25 Para  2  Other Problems Alean Rinne, [place]; 09/21/2017 10:56 AM) Arthritis  Asthma  Back Pain  Bladder Problems  Gastroesophageal Reflux Disease  Hemorrhoids  High blood pressure  Kidney Stone  Migraine Headache  Sleep Apnea     Review of Systems Ralene Ok MD; 09/21/2017 11:06 AM) General Present- Appetite Loss, Night Sweats and Weight Gain. Not Present- Chills, Fatigue, Fever and Weight Loss. Skin Present- Dryness. Not Present- Change in Wart/Mole, Hives, Jaundice, New Lesions, Non-Healing Wounds, Rash and Ulcer. HEENT Present- Seasonal Allergies and Wears glasses/contact lenses. Not Present- Earache, Hearing Loss, Hoarseness, Nose Bleed, Oral Ulcers, Ringing in the Ears, Sinus Pain, Sore Throat, Visual Disturbances and Yellow Eyes. Respiratory Present- Snoring. Not Present- Bloody sputum, Chronic Cough, Difficulty Breathing and Wheezing. Breast Not Present- Breast Mass, Breast Pain, Nipple Discharge and Skin Changes. Cardiovascular Present- Leg Cramps and Swelling of Extremities. Not Present- Chest Pain, Difficulty Breathing Lying Down, Palpitations, Rapid Heart Rate and Shortness of Breath. Gastrointestinal Present- Abdominal Pain, Bloating, Change in Bowel Habits, Constipation, Excessive gas, Gets full quickly at meals and Nausea. Not Present- Bloody Stool, Chronic diarrhea, Difficulty Swallowing, Hemorrhoids, Indigestion,  Rectal Pain and Vomiting. Female Genitourinary Present- Urgency. Not Present- Frequency, Nocturia, Painful Urination and Pelvic Pain. Musculoskeletal Present- Joint Pain, Muscle Weakness and  Swelling of Extremities. Not Present- Back Pain, Joint Stiffness and Muscle Pain. Neurological Present- Headaches. Not Present- Decreased Memory, Fainting, Numbness, Seizures, Tingling, Tremor, Trouble walking and Weakness. Psychiatric Not Present- Anxiety, Bipolar, Change in Sleep Pattern, Depression, Fearful and Frequent crying. Endocrine Present- Cold Intolerance. Not Present- Excessive Hunger, Hair Changes, Heat Intolerance, Hot flashes and New Diabetes. Hematology Not Present- Blood Thinners, Easy Bruising, Excessive bleeding, Gland problems, HIV and Persistent Infections. All other systems negative  Vitals Alean Rinne RMA; 09/21/2017 10:57 AM) 09/21/2017 10:56 AM Weight: 311.8 lb Height: 67in Body Surface Area: 2.44 m Body Mass Index: 48.83 kg/m  Temp.: 97.33F  Pulse: 94 (Regular)  BP: 138/92 (Sitting, Left Arm, Standard)       Physical Exam Ralene Ok MD; 09/21/2017 11:09 AM) The physical exam findings are as follows: Note:Constitutional: No acute distress, conversant, appears stated age  Eyes: Anicteric sclerae, moist conjunctiva, no lid lag  Neck: No thyromegaly, trachea midline, no cervical lymphadenopathy  Lungs: Clear to auscultation biilaterally, normal respiratory effot  Cardiovascular: regular rate & rhythm, no murmurs, no peripheal edema, pedal pulses 2+  GI: Soft, no masses or hepatosplenomegaly, non-tender to palpation  MSK: Normal gait, no clubbing cyanosis, edema  Skin: No rashes, palpation reveals normal skin turgor  Psychiatric: Appropriate judgment and insight, oriented to person, place, and time  Abdomen Inspection Hernias - Umbilical hernia - Reducible(approx 1-2cm).    Assessment & Plan Ralene Ok MD; 03/18/5630 49:70  AM) UMBILICAL HERNIA WITHOUT OBSTRUCTION AND WITHOUT GANGRENE (K42.9) Impression: 52 year old female with a umbilical hernia  1. The patient will like to proceed to the operating room for laparoscopic umbilical hernia repair with mesh.  2. I discussed with the patient the signs and symptoms of incarceration and strangulation and the need to proceed to the ER should they occur.  3. I discussed with the patient the risks and benefits of the procedure to include but not limited to: Infection, bleeding, damage to surrounding structures, possible need for further surgery, possible nerve pain, and possible recurrence. The patient was understanding and wishes to proceed.

## 2017-09-21 NOTE — Telephone Encounter (Signed)
OK to close encounter. 

## 2017-10-06 DIAGNOSIS — G4733 Obstructive sleep apnea (adult) (pediatric): Secondary | ICD-10-CM | POA: Diagnosis not present

## 2017-10-23 DIAGNOSIS — G4733 Obstructive sleep apnea (adult) (pediatric): Secondary | ICD-10-CM | POA: Diagnosis not present

## 2017-11-06 DIAGNOSIS — G4733 Obstructive sleep apnea (adult) (pediatric): Secondary | ICD-10-CM | POA: Diagnosis not present

## 2017-11-21 ENCOUNTER — Other Ambulatory Visit: Payer: Self-pay | Admitting: Obstetrics & Gynecology

## 2017-11-21 MED ORDER — TROSPIUM CHLORIDE ER 60 MG PO CP24: 1.0000 | ORAL_CAPSULE | Freq: Every day | ORAL | 0 refills | Status: DC

## 2017-11-21 NOTE — Telephone Encounter (Signed)
Medication refill request: Trospium chloride 60mg   Last AEX:  12/05/16 SM  Next AEX: 02/08/18  Last MMG (if hormonal medication request): 03/28/16 BIRADS 1 negative  Refill authorized: 12/05/16 #90, 4RF. Today, please advise.   Routing to covering provider.   Spoke with patient. Patient states that her insurance has picked back up covering trospium chloride, so patient requesting a refill as she is out of medication. Patient states that it needs to be filled as a 90 day supply as that is cheaper on her plan. [facility] [address] RN advised request would be sent to covering provider as Dr. Sabra Heck out of office. Patient agreeable. Patient aware Dr. Talbert Nan is seeing patients, so response may not be immediate.

## 2017-11-21 NOTE — Telephone Encounter (Signed)
Patient called and asked that her prescription be filled for 90 days instead of 30 days.

## 2017-11-27 NOTE — Pre-Procedure Instructions (Signed)
Katelyn Cohen  11/27/2017      [place] [address] [place] [address] [place] [address] Phone: [phone] Fax: [place], [place] [facility] [address] [place] [address] Phone: [phone] Fax: [phone]  [id number] [place] [place] [address] [place] [address] Phone: [phone] Fax: [phone]    Your procedure is scheduled on 09/08/2017.  Report to [facility] Admitting at [place].M.  Call this number if you have problems the morning of surgery:  [phone]   Remember:  Do not eat food or drink liquids after midnight.   Continue all medications as directed by your physician except follow these medication instructions before surgery below   Take these medicines the morning of surgery with A SIP OF WATER: Benzonatate (Tessalon) Methocarbamol (Robaxin) - if needed Proair HFA inhaler - if needed (Bring with you to the hospital the morning of surgery) QVAR inhaler Tramadol (Ultram) - if needed  7 days prior to surgery STOP taking any Meloxicam (Mobic), Aspirin (unless otherwise instructed by your surgeon), Aleve, Naproxen, Ibuprofen, Motrin, Advil, Goody's, BC's, all herbal medications, fish oil, and all vitamins    Do not wear jewelry, make-up or nail polish.  Do not wear lotions, powders, or perfumes, or deodorant.  Do not shave 48 hours prior to surgery.    Do not bring valuables to the hospital.  [facility] is not responsible for any belongings or valuables.  Hearing aids, eyeglasses, contacts, dentures or bridgework may not be worn into surgery.  Leave your suitcase in the car.  After surgery it may be brought to your room.  For patients admitted to the hospital, discharge time will be determined by your treatment team.  Patients discharged the day of surgery will not be allowed to  drive home.   Name and phone number of your driver:    Special instructions:   [place]- Preparing For Surgery  Before surgery, you can play an important role. Because skin is not sterile, your skin needs to be as free of germs as possible. You can reduce the number of germs on your skin by washing with CHG (chlorahexidine gluconate) Soap before surgery.  CHG is an antiseptic cleaner which kills germs and bonds with the skin to continue killing germs even after washing.  Please do not use if you have an allergy to CHG or antibacterial soaps. If your skin becomes reddened/irritated stop using the CHG.  Do not shave (including legs and underarms) for at least 48 hours prior to first CHG shower. It is OK to shave your face.  Please follow these instructions carefully.   1. Shower the NIGHT BEFORE SURGERY and the MORNING OF SURGERY with CHG.   2. If you chose to wash your hair, wash your hair first as usual with your normal shampoo.  3. After you shampoo, rinse your hair and body thoroughly to remove the shampoo.  4. Use CHG as you would any other liquid soap. You can apply CHG directly to the skin and wash gently with a scrungie or a clean washcloth.   5. Apply the CHG Soap to your body ONLY FROM THE NECK DOWN.  Do not use on open wounds or open sores. Avoid contact with your eyes, ears, mouth and genitals (private parts). Wash Face and genitals (private parts)  with your normal soap.  6. Wash thoroughly, paying special attention to the area where your surgery will be performed.  7. Thoroughly rinse your body with warm water from the neck down.  8. DO NOT shower/wash with your normal soap after using and rinsing off the CHG Soap.  9. Pat yourself dry with a CLEAN TOWEL.  10. Wear CLEAN PAJAMAS to bed the night before surgery, wear comfortable clothes the morning of surgery  11. Place CLEAN SHEETS on your bed the night of your first shower and DO NOT SLEEP WITH PETS.    Day of  Surgery: Shower as stated above. Do not apply any deodorants/lotions. Please wear clean clothes to the hospital/surgery center.      Please read over the following fact sheets that you were given.

## 2017-11-28 ENCOUNTER — Encounter (HOSPITAL_COMMUNITY)
Admission: RE | Admit: 2017-11-28 | Discharge: 2017-11-28 | Disposition: A | Discharge status: Home / Self Care | Payer: BLUE CROSS/BLUE SHIELD | Source: Ambulatory Visit | Attending: General Surgery | Admitting: General Surgery

## 2017-11-28 ENCOUNTER — Encounter (HOSPITAL_COMMUNITY): Payer: Self-pay | Admitting: *Deleted

## 2017-11-28 DIAGNOSIS — Z01812 Encounter for preprocedural laboratory examination: Secondary | ICD-10-CM | POA: Insufficient documentation

## 2017-11-28 DIAGNOSIS — Z01810 Encounter for preprocedural cardiovascular examination: Secondary | ICD-10-CM | POA: Diagnosis not present

## 2017-11-28 DIAGNOSIS — I10 Essential (primary) hypertension: Secondary | ICD-10-CM | POA: Diagnosis not present

## 2017-11-28 HISTORY — DX: Unspecified urinary incontinence: R32

## 2017-11-28 HISTORY — DX: Personal history of urinary calculi: Z87.442

## 2017-11-28 HISTORY — DX: Claustrophobia: F40.240

## 2017-11-28 HISTORY — DX: Other complications of anesthesia, initial encounter: T88.59XA

## 2017-11-28 HISTORY — DX: Unspecified asthma, uncomplicated: J45.909

## 2017-11-28 HISTORY — DX: Adverse effect of unspecified anesthetic, initial encounter: T41.45XA

## 2017-11-28 HISTORY — DX: Sleep apnea, unspecified: G47.30

## 2017-11-28 HISTORY — DX: Unspecified osteoarthritis, unspecified site: M19.90

## 2017-11-28 LAB — BASIC METABOLIC PANEL
Anion gap: 6 (ref 5–15)
BUN: 14 mg/dL (ref 6–20)
CO2: 29 mmol/L (ref 22–32)
Calcium: 9.2 mg/dL (ref 8.9–10.3)
Chloride: 104 mmol/L (ref 101–111)
Creatinine, Ser: 0.88 mg/dL (ref 0.44–1.00)
GFR calc Af Amer: >60 mL/min (ref >60)
GFR calc non Af Amer: >60 mL/min (ref >60)
Glucose, Bld: 96 mg/dL (ref 65–99)
Potassium: 3.8 mmol/L (ref 3.5–5.1)
Sodium: 139 mmol/L (ref 135–145)

## 2017-11-28 LAB — CBC
HCT: 38.6 % (ref 36.0–46.0)
Hemoglobin: 12.7 g/dL (ref 12.0–15.0)
MCH: 28.3 pg (ref 26.0–34.0)
MCHC: 32.9 g/dL (ref 30.0–36.0)
MCV: 86 fL (ref 78.0–100.0)
Platelets: 354 10*3/uL (ref 150–400)
RBC: 4.49 MIL/uL (ref 3.87–5.11)
RDW: 13.6 % (ref 11.5–15.5)
WBC: 8.4 10*3/uL (ref 4.0–10.5)

## 2017-12-06 ENCOUNTER — Ambulatory Visit (HOSPITAL_COMMUNITY)
Admission: RE | Admit: 2017-12-06 | Discharge: 2017-12-06 | Disposition: A | Discharge status: Home / Self Care | Payer: BLUE CROSS/BLUE SHIELD | Source: Ambulatory Visit | Attending: General Surgery | Admitting: General Surgery

## 2017-12-06 ENCOUNTER — Encounter (HOSPITAL_COMMUNITY): Payer: Self-pay | Admitting: *Deleted

## 2017-12-06 ENCOUNTER — Encounter (HOSPITAL_COMMUNITY)
Admission: RE | Disposition: A | Discharge status: Home / Self Care | Payer: Self-pay | Source: Ambulatory Visit | Attending: General Surgery

## 2017-12-06 ENCOUNTER — Ambulatory Visit (HOSPITAL_COMMUNITY): Payer: BLUE CROSS/BLUE SHIELD | Admitting: Anesthesiology

## 2017-12-06 DIAGNOSIS — Z885 Allergy status to narcotic agent status: Secondary | ICD-10-CM | POA: Diagnosis not present

## 2017-12-06 DIAGNOSIS — G4733 Obstructive sleep apnea (adult) (pediatric): Secondary | ICD-10-CM | POA: Diagnosis not present

## 2017-12-06 DIAGNOSIS — K219 Gastro-esophageal reflux disease without esophagitis: Secondary | ICD-10-CM | POA: Diagnosis not present

## 2017-12-06 DIAGNOSIS — F419 Anxiety disorder, unspecified: Secondary | ICD-10-CM | POA: Insufficient documentation

## 2017-12-06 DIAGNOSIS — J309 Allergic rhinitis, unspecified: Secondary | ICD-10-CM | POA: Diagnosis not present

## 2017-12-06 DIAGNOSIS — Z6841 Body Mass Index (BMI) 40.0 and over, adult: Secondary | ICD-10-CM | POA: Insufficient documentation

## 2017-12-06 DIAGNOSIS — M1990 Unspecified osteoarthritis, unspecified site: Secondary | ICD-10-CM | POA: Insufficient documentation

## 2017-12-06 DIAGNOSIS — Z79899 Other long term (current) drug therapy: Secondary | ICD-10-CM | POA: Diagnosis not present

## 2017-12-06 DIAGNOSIS — D252 Subserosal leiomyoma of uterus: Secondary | ICD-10-CM | POA: Diagnosis not present

## 2017-12-06 DIAGNOSIS — Z880 Allergy status to penicillin: Secondary | ICD-10-CM | POA: Insufficient documentation

## 2017-12-06 DIAGNOSIS — J45909 Unspecified asthma, uncomplicated: Secondary | ICD-10-CM | POA: Diagnosis not present

## 2017-12-06 DIAGNOSIS — G4730 Sleep apnea, unspecified: Secondary | ICD-10-CM | POA: Insufficient documentation

## 2017-12-06 DIAGNOSIS — Z888 Allergy status to other drugs, medicaments and biological substances status: Secondary | ICD-10-CM | POA: Insufficient documentation

## 2017-12-06 DIAGNOSIS — I10 Essential (primary) hypertension: Secondary | ICD-10-CM | POA: Insufficient documentation

## 2017-12-06 DIAGNOSIS — K420 Umbilical hernia with obstruction, without gangrene: Secondary | ICD-10-CM | POA: Insufficient documentation

## 2017-12-06 HISTORY — PX: INSERTION OF MESH: SHX5868

## 2017-12-06 HISTORY — PX: UMBILICAL HERNIA REPAIR: SHX196

## 2017-12-06 LAB — POCT PREGNANCY, URINE: Preg Test, Ur: NEGATIVE

## 2017-12-06 SURGERY — REPAIR, HERNIA, UMBILICAL, LAPAROSCOPIC
Anesthesia: General | Site: Abdomen

## 2017-12-06 MED ORDER — SUGAMMADEX SODIUM 200 MG/2ML IV SOLN
INTRAVENOUS | Status: AC
  Filled 2017-12-06: qty 2

## 2017-12-06 MED ORDER — FENTANYL CITRATE (PF) 100 MCG/2ML IJ SOLN
INTRAMUSCULAR | Status: DC | PRN
  Administered 2017-12-06 (×2): 50 ug via INTRAVENOUS
  Administered 2017-12-06 (×2): 100 ug via INTRAVENOUS

## 2017-12-06 MED ORDER — PROPOFOL 10 MG/ML IV BOLUS
INTRAVENOUS | Status: DC | PRN
  Administered 2017-12-06: 200 mg via INTRAVENOUS

## 2017-12-06 MED ORDER — ROCURONIUM BROMIDE 100 MG/10ML IV SOLN
INTRAVENOUS | Status: DC | PRN
  Administered 2017-12-06: 20 mg via INTRAVENOUS

## 2017-12-06 MED ORDER — CELECOXIB 200 MG PO CAPS
200.0000 mg | ORAL_CAPSULE | ORAL | Status: AC
  Administered 2017-12-06: 200 mg via ORAL
  Filled 2017-12-06: qty 1

## 2017-12-06 MED ORDER — ONDANSETRON HCL 4 MG/2ML IJ SOLN
INTRAMUSCULAR | Status: AC
  Filled 2017-12-06: qty 2

## 2017-12-06 MED ORDER — PHENYLEPHRINE 40 MCG/ML (10ML) SYRINGE FOR IV PUSH (FOR BLOOD PRESSURE SUPPORT)
PREFILLED_SYRINGE | INTRAVENOUS | Status: AC
  Filled 2017-12-06: qty 10

## 2017-12-06 MED ORDER — FENTANYL CITRATE (PF) 100 MCG/2ML IJ SOLN
INTRAMUSCULAR | Status: AC
  Filled 2017-12-06: qty 2

## 2017-12-06 MED ORDER — CHLORHEXIDINE GLUCONATE CLOTH 2 % EX PADS: 6.0000 | MEDICATED_PAD | Freq: Once | CUTANEOUS | Status: DC

## 2017-12-06 MED ORDER — LIDOCAINE 2% (20 MG/ML) 5 ML SYRINGE
INTRAMUSCULAR | Status: AC
  Filled 2017-12-06: qty 10

## 2017-12-06 MED ORDER — PROMETHAZINE HCL 25 MG/ML IJ SOLN
6.2500 mg | INTRAMUSCULAR | Status: DC | PRN
  Administered 2017-12-06: 6.25 mg via INTRAVENOUS

## 2017-12-06 MED ORDER — ONDANSETRON HCL 4 MG/2ML IJ SOLN
INTRAMUSCULAR | Status: DC | PRN
  Administered 2017-12-06: 4 mg via INTRAVENOUS

## 2017-12-06 MED ORDER — CEFAZOLIN SODIUM-DEXTROSE 2-4 GM/100ML-% IV SOLN
INTRAVENOUS | Status: AC
  Filled 2017-12-06: qty 100

## 2017-12-06 MED ORDER — GABAPENTIN 300 MG PO CAPS
300.0000 mg | ORAL_CAPSULE | ORAL | Status: AC
  Administered 2017-12-06: 300 mg via ORAL
  Filled 2017-12-06: qty 1

## 2017-12-06 MED ORDER — 0.9 % SODIUM CHLORIDE (POUR BTL) OPTIME
TOPICAL | Status: DC | PRN
  Administered 2017-12-06: 1000 mL

## 2017-12-06 MED ORDER — MIDAZOLAM HCL 5 MG/5ML IJ SOLN
INTRAMUSCULAR | Status: DC | PRN
  Administered 2017-12-06: 2 mg via INTRAVENOUS

## 2017-12-06 MED ORDER — SUGAMMADEX SODIUM 200 MG/2ML IV SOLN
INTRAVENOUS | Status: DC | PRN
  Administered 2017-12-06: 200 mg via INTRAVENOUS

## 2017-12-06 MED ORDER — OXYCODONE HCL 5 MG/5ML PO SOLN: 5.0000 mg | Freq: Once | ORAL | Status: DC | PRN

## 2017-12-06 MED ORDER — FENTANYL CITRATE (PF) 250 MCG/5ML IJ SOLN
INTRAMUSCULAR | Status: AC
  Filled 2017-12-06: qty 5

## 2017-12-06 MED ORDER — LACTATED RINGERS IV SOLN
INTRAVENOUS | Status: DC | PRN
  Administered 2017-12-06: 07:00:00 via INTRAVENOUS

## 2017-12-06 MED ORDER — ONDANSETRON HCL 4 MG/2ML IJ SOLN
4.0000 mg | Freq: Four times a day (QID) | INTRAMUSCULAR | Status: AC | PRN
  Administered 2017-12-06: 4 mg via INTRAVENOUS

## 2017-12-06 MED ORDER — ROCURONIUM BROMIDE 50 MG/5ML IV SOLN
INTRAVENOUS | Status: AC
  Filled 2017-12-06: qty 1

## 2017-12-06 MED ORDER — EPHEDRINE 5 MG/ML INJ
INTRAVENOUS | Status: AC
  Filled 2017-12-06: qty 10

## 2017-12-06 MED ORDER — SUCCINYLCHOLINE CHLORIDE 200 MG/10ML IV SOSY
PREFILLED_SYRINGE | INTRAVENOUS | Status: AC
  Filled 2017-12-06: qty 10

## 2017-12-06 MED ORDER — OXYCODONE HCL 5 MG PO TABS: 5.0000 mg | ORAL_TABLET | Freq: Once | ORAL | Status: DC | PRN

## 2017-12-06 MED ORDER — ACETAMINOPHEN 500 MG PO TABS
1000.0000 mg | ORAL_TABLET | ORAL | Status: AC
  Administered 2017-12-06: 1000 mg via ORAL
  Filled 2017-12-06: qty 2

## 2017-12-06 MED ORDER — CEFAZOLIN SODIUM-DEXTROSE 2-3 GM-%(50ML) IV SOLR
INTRAVENOUS | Status: DC | PRN
  Administered 2017-12-06: 2 g via INTRAVENOUS

## 2017-12-06 MED ORDER — FENTANYL CITRATE (PF) 100 MCG/2ML IJ SOLN
25.0000 ug | INTRAMUSCULAR | Status: DC | PRN
  Administered 2017-12-06 (×3): 50 ug via INTRAVENOUS

## 2017-12-06 MED ORDER — LIDOCAINE HCL (CARDIAC) PF 100 MG/5ML IV SOSY
PREFILLED_SYRINGE | INTRAVENOUS | Status: DC | PRN
  Administered 2017-12-06: 60 mg via INTRAVENOUS

## 2017-12-06 MED ORDER — STERILE WATER FOR IRRIGATION IR SOLN
Status: DC | PRN
  Administered 2017-12-06: 1000 mL

## 2017-12-06 MED ORDER — BUPIVACAINE HCL 0.25 % IJ SOLN
INTRAMUSCULAR | Status: DC | PRN
  Administered 2017-12-06: 7 mL

## 2017-12-06 MED ORDER — OXYCODONE HCL 5 MG PO TABS
ORAL_TABLET | ORAL | Status: AC
  Filled 2017-12-06: qty 1

## 2017-12-06 MED ORDER — DEXAMETHASONE SODIUM PHOSPHATE 10 MG/ML IJ SOLN
INTRAMUSCULAR | Status: DC | PRN
  Administered 2017-12-06: 10 mg via INTRAVENOUS

## 2017-12-06 MED ORDER — PROPOFOL 10 MG/ML IV BOLUS
INTRAVENOUS | Status: AC
  Filled 2017-12-06: qty 20

## 2017-12-06 MED ORDER — DEXAMETHASONE SODIUM PHOSPHATE 10 MG/ML IJ SOLN
INTRAMUSCULAR | Status: AC
  Filled 2017-12-06: qty 1

## 2017-12-06 MED ORDER — MIDAZOLAM HCL 2 MG/2ML IJ SOLN
INTRAMUSCULAR | Status: AC
  Filled 2017-12-06: qty 2

## 2017-12-06 MED ORDER — PROMETHAZINE HCL 25 MG/ML IJ SOLN
INTRAMUSCULAR | Status: AC
  Filled 2017-12-06: qty 1

## 2017-12-06 MED ORDER — BUPIVACAINE HCL (PF) 0.25 % IJ SOLN
INTRAMUSCULAR | Status: AC
  Filled 2017-12-06: qty 30

## 2017-12-06 MED ORDER — TRAMADOL HCL 50 MG PO TABS: 50.0000 mg | ORAL_TABLET | Freq: Four times a day (QID) | ORAL | 0 refills | Status: DC | PRN

## 2017-12-06 SURGICAL SUPPLY — 45 items
ADH SKN CLS APL DERMABOND .7 (GAUZE/BANDAGES/DRESSINGS) ×1
APL SKNCLS STERI-STRIP NONHPOA (GAUZE/BANDAGES/DRESSINGS) ×1
BENZOIN TINCTURE PRP APPL 2/3 (GAUZE/BANDAGES/DRESSINGS) ×2 IMPLANT
BLADE CLIPPER SURG (BLADE) ×1 IMPLANT
CHLORAPREP W/TINT 26ML (MISCELLANEOUS) ×2 IMPLANT
COVER SURGICAL LIGHT HANDLE (MISCELLANEOUS) ×2 IMPLANT
DERMABOND ADVANCED (GAUZE/BANDAGES/DRESSINGS) ×1
DERMABOND ADVANCED .7 DNX12 (GAUZE/BANDAGES/DRESSINGS) IMPLANT
DEVICE SECURE STRAP 25 ABSORB (INSTRUMENTS) ×3 IMPLANT
ELECT REM PT RETURN 9FT ADLT (ELECTROSURGICAL) ×2
ELECTRODE REM PT RTRN 9FT ADLT (ELECTROSURGICAL) ×1 IMPLANT
GAUZE SPONGE 2X2 8PLY STRL LF (GAUZE/BANDAGES/DRESSINGS) ×1 IMPLANT
GLOVE BIO SURGEON STRL SZ 6.5 (GLOVE) ×1 IMPLANT
GLOVE BIO SURGEON STRL SZ7.5 (GLOVE) ×2 IMPLANT
GLOVE BIOGEL PI IND STRL 6.5 (GLOVE) IMPLANT
GLOVE BIOGEL PI IND STRL 7.0 (GLOVE) IMPLANT
GLOVE BIOGEL PI INDICATOR 6.5 (GLOVE) ×1
GLOVE BIOGEL PI INDICATOR 7.0 (GLOVE) ×1
GLOVE SURG SS PI 6.5 STRL IVOR (GLOVE) ×1 IMPLANT
GOWN STRL REUS W/ TWL LRG LVL3 (GOWN DISPOSABLE) ×2 IMPLANT
GOWN STRL REUS W/ TWL XL LVL3 (GOWN DISPOSABLE) ×1 IMPLANT
GOWN STRL REUS W/TWL LRG LVL3 (GOWN DISPOSABLE) ×2
GOWN STRL REUS W/TWL XL LVL3 (GOWN DISPOSABLE) ×6
GRASPER SUT TROCAR 14GX15 (MISCELLANEOUS) ×2 IMPLANT
KIT BASIN OR (CUSTOM PROCEDURE TRAY) ×2 IMPLANT
KIT TURNOVER KIT B (KITS) ×2 IMPLANT
MARKER SKIN DUAL TIP RULER LAB (MISCELLANEOUS) ×2 IMPLANT
MESH VENTRALIGHT ST 6IN CRC (Mesh General) ×1 IMPLANT
NDL INSUFFLATION 14GA 120MM (NEEDLE) ×1 IMPLANT
NEEDLE INSUFFLATION 14GA 120MM (NEEDLE) ×2 IMPLANT
NS IRRIG 1000ML POUR BTL (IV SOLUTION) ×2 IMPLANT
PAD ARMBOARD 7.5X6 YLW CONV (MISCELLANEOUS) ×4 IMPLANT
SCISSORS LAP 5X35 DISP (ENDOMECHANICALS) ×2 IMPLANT
SLEEVE ENDOPATH XCEL 5M (ENDOMECHANICALS) ×3 IMPLANT
SPONGE GAUZE 2X2 STER 10/PKG (GAUZE/BANDAGES/DRESSINGS) ×1
STRIP CLOSURE SKIN 1/2X4 (GAUZE/BANDAGES/DRESSINGS) ×2 IMPLANT
SUT CHROMIC 2 0 SH (SUTURE) ×2 IMPLANT
SUT ETHIBOND 0 MO6 C/R (SUTURE) ×1 IMPLANT
SUT MNCRL AB 4-0 PS2 18 (SUTURE) ×3 IMPLANT
SUT PROLENE 2 0 KS (SUTURE) IMPLANT
TOWEL GREEN STERILE (TOWEL DISPOSABLE) ×1 IMPLANT
TRAY LAPAROSCOPIC MC (CUSTOM PROCEDURE TRAY) ×2 IMPLANT
TROCAR XCEL NON-BLD 5MMX100MML (ENDOMECHANICALS) ×2 IMPLANT
TUBING INSUFFLATION (TUBING) ×2 IMPLANT
WATER STERILE IRR 1000ML POUR (IV SOLUTION) ×2 IMPLANT

## 2017-12-06 NOTE — Progress Notes (Signed)
Pt. States that penicillin reaction was 12 yrs ago and she had vagina swelling. Pt. States it was a large oral dose of penicillin.

## 2017-12-06 NOTE — H&P (Signed)
History of Present Illness  The patient is a 52 year old female who presents with an umbilical hernia. Referred by: Marda Stalker- [facility] Chief Complaint: Umbilical hernia  Patient is a 52 year old female who comes in with a 3 to four-month history of an umbilical hernia. She states that she's had some issues with some incarceration however no strangulation. Patient states that she does have discomfort and pain when active. She states that she has been able to reduce the hernia when laying down. She states that she has had no previous abdominal surgery in the past, suffers from C-section.  Patient is currently working on her shoulder with physical therapy, in the pool.   Past Surgical History Cesarean Section - Multiple  Oral Surgery   Diagnostic Studies History  Colonoscopy  never Mammogram  1-3 years ago Pap Smear  1-5 years ago  Allergies  Penicillins  Allergies Reconciled   Medication History  TraMADol HCl (50MG  Tablet, Oral) Active. Losartan Potassium (100MG  Tablet, Oral) Active. Diclofenac Sodium (1% Gel, Transdermal) Active. HydroCHLOROthiazide (25MG  Tablet, Oral) Active. Qvar RediHaler (40MCG/ACT Aero Breath Act, Inhalation) Active. Methocarbamol (500MG  Tablet, Oral) Active. Trospium Chloride ER (60MG  Capsule ER 24HR, Oral) Active. Oseltamivir Phosphate (75MG  Capsule, Oral) Active. Medications Reconciled  Social History No alcohol use  No caffeine use  No drug use  Tobacco use  Never smoker.  Family History  Alcohol Abuse  Family Members In General, Sister. Arthritis  Father, Mother, Sister. Bleeding disorder  Sister. Breast Cancer  Family Members In General. Cancer  Father. Colon Cancer  Father. Depression  Daughter, Sister. Hypertension  Father, Mother. Migraine Headache  Daughter, Sister. Ovarian Cancer  Family Members In General. Rectal Cancer  Family Members In General. Respiratory Condition   Father. Thyroid problems  Father, Mother.  Pregnancy / Birth History Age at menarche  19 years. Contraceptive History  Oral contraceptives. Gravida  4 Irregular periods  Length (months) of breastfeeding  7-12 Maternal age  37-25 Para  2  Other Problems Arthritis  Asthma  Back Pain  Bladder Problems  Gastroesophageal Reflux Disease  Hemorrhoids  High blood pressure  Kidney Stone  Migraine Headache  Sleep Apnea     Review of Systems General Present- Appetite Loss, Night Sweats and Weight Gain. Not Present- Chills, Fatigue, Fever and Weight Loss. Skin Present- Dryness. Not Present- Change in Wart/Mole, Hives, Jaundice, New Lesions, Non-Healing Wounds, Rash and Ulcer. HEENT Present- Seasonal Allergies and Wears glasses/contact lenses. Not Present- Earache, Hearing Loss, Hoarseness, Nose Bleed, Oral Ulcers, Ringing in the Ears, Sinus Pain, Sore Throat, Visual Disturbances and Yellow Eyes. Respiratory Present- Snoring. Not Present- Bloody sputum, Chronic Cough, Difficulty Breathing and Wheezing. Breast Not Present- Breast Mass, Breast Pain, Nipple Discharge and Skin Changes. Cardiovascular Present- Leg Cramps and Swelling of Extremities. Not Present- Chest Pain, Difficulty Breathing Lying Down, Palpitations, Rapid Heart Rate and Shortness of Breath. Gastrointestinal Present- Abdominal Pain, Bloating, Change in Bowel Habits, Constipation, Excessive gas, Gets full quickly at meals and Nausea. Not Present- Bloody Stool, Chronic diarrhea, Difficulty Swallowing, Hemorrhoids, Indigestion, Rectal Pain and Vomiting. Female Genitourinary Present- Urgency. Not Present- Frequency, Nocturia, Painful Urination and Pelvic Pain. Musculoskeletal Present- Joint Pain, Muscle Weakness and Swelling of Extremities. Not Present- Back Pain, Joint Stiffness and Muscle Pain. Neurological Present- Headaches. Not Present- Decreased Memory, Fainting, Numbness, Seizures, Tingling, Tremor,  Trouble walking and Weakness. Psychiatric Not Present- Anxiety, Bipolar, Change in Sleep Pattern, Depression, Fearful and Frequent crying. Endocrine Present- Cold Intolerance. Not Present- Excessive Hunger, Hair Changes, Heat Intolerance, Hot  flashes and New Diabetes. Hematology Not Present- Blood Thinners, Easy Bruising, Excessive bleeding, Gland problems, HIV and Persistent Infections. All other systems negative  BP 130/86   Pulse 93   Temp 98.6 F (37 C) (Oral)   Resp 20   SpO2 97%      Physical Exam  The physical exam findings are as follows: Note:Constitutional: No acute distress, conversant, appears stated age  Eyes: Anicteric sclerae, moist conjunctiva, no lid lag  Neck: No thyromegaly, trachea midline, no cervical lymphadenopathy  Lungs: Clear to auscultation biilaterally, normal respiratory effot  Cardiovascular: regular rate & rhythm, no murmurs, no peripheal edema, pedal pulses 2+  GI: Soft, no masses or hepatosplenomegaly, non-tender to palpation  MSK: Normal gait, no clubbing cyanosis, edema  Skin: No rashes, palpation reveals normal skin turgor  Psychiatric: Appropriate judgment and insight, oriented to person, place, and time  Abdomen Inspection Hernias - Umbilical hernia - Reducible(approx 1-2cm).    Assessment & Plan  UMBILICAL HERNIA WITHOUT OBSTRUCTION AND WITHOUT GANGRENE (K42.9) Impression: 52 year old female with a umbilical hernia  1. The patient will like to proceed to the operating room for laparoscopic umbilical hernia repair with mesh.  2. I discussed with the patient the signs and symptoms of incarceration and strangulation and the need to proceed to the ER should they occur.  3. I discussed with the patient the risks and benefits of the procedure to include but not limited to: Infection, bleeding, damage to surrounding structures, possible need for further surgery, possible nerve pain, and possible recurrence. The  patient was understanding and wishes to proceed.

## 2017-12-06 NOTE — Anesthesia Preprocedure Evaluation (Signed)
Anesthesia Evaluation  Patient identified by MRN, date of birth, ID band Patient awake    Reviewed: Allergy & Precautions, H&P , NPO status , Patient's Chart, lab work & pertinent test results  Airway Mallampati: II   Neck ROM: full    Dental   Pulmonary asthma , sleep apnea ,    breath sounds clear to auscultation       Cardiovascular hypertension,  Rhythm:regular Rate:Normal     Neuro/Psych PSYCHIATRIC DISORDERS Anxiety    GI/Hepatic   Endo/Other  Morbid obesity  Renal/GU      Musculoskeletal  (+) Arthritis ,   Abdominal   Peds  Hematology   Anesthesia Other Findings   Reproductive/Obstetrics                             Anesthesia Physical Anesthesia Plan  ASA: III  Anesthesia Plan: General   Post-op Pain Management:    Induction: Intravenous  PONV Risk Score and Plan: 3 and Ondansetron, Dexamethasone, Midazolam and Treatment may vary due to age or medical condition  Airway Management Planned: Oral ETT  Additional Equipment:   Intra-op Plan:   Post-operative Plan: Extubation in OR  Informed Consent: I have reviewed the patients History and Physical, chart, labs and discussed the procedure including the risks, benefits and alternatives for the proposed anesthesia with the patient or authorized representative who has indicated his/her understanding and acceptance.     Plan Discussed with: CRNA, Anesthesiologist and Surgeon  Anesthesia Plan Comments:         Anesthesia Quick Evaluation

## 2017-12-06 NOTE — Discharge Instructions (Signed)
[facility] _______[facility] ° °HERNIA REPAIR: POST OP INSTRUCTIONS ° °Always review your discharge instruction sheet given to you by the facility where your surgery was performed. °IF YOU HAVE DISABILITY OR FAMILY LEAVE FORMS, YOU MUST BRING THEM TO THE OFFICE FOR PROCESSING.   °DO NOT GIVE THEM TO YOUR DOCTOR. ° °1. A  prescription for pain medication may be given to you upon discharge.  Take your pain medication as prescribed, if needed.  If narcotic pain medicine is not needed, then you may take acetaminophen (Tylenol) or ibuprofen (Advil) as needed. °2. Take your usually prescribed medications unless otherwise directed. °If you need a refill on your pain medication, please contact your pharmacy.  They will contact our office to request authorization. Prescriptions will not be filled after 5 pm or on week-ends. °3. You should follow a light diet the first 24 hours after arrival home, such as soup and crackers, etc.  Be sure to include lots of fluids daily.  Resume your normal diet the day after surgery. °4.Most patients will experience some swelling and bruising around the umbilicus or in the groin and scrotum.  Ice packs and reclining will help.  Swelling and bruising can take several days to resolve.  °6. It is common to experience some constipation if taking pain medication after surgery.  Increasing fluid intake and taking a stool softener (such as Colace) will usually help or prevent this problem from occurring.  A mild laxative (Milk of Magnesia or Miralax) should be taken according to package directions if there are no bowel movements after 48 hours. °7. Unless discharge instructions indicate otherwise, you may remove your bandages 24-48 hours after surgery, and you may shower at that time.  You may have steri-strips (small skin tapes) in place directly over the incision.  These strips should be left on the skin for 7-10 days.  If your surgeon used skin glue on the incision, you may shower in  24 hours.  The glue will flake off over the next 2-3 weeks.  Any sutures or staples will be removed at the office during your follow-up visit. °8. ACTIVITIES:  You may resume regular (light) daily activities beginning the next day--such as daily self-care, walking, climbing stairs--gradually increasing activities as tolerated.  You may have sexual intercourse when it is comfortable.  Refrain from any heavy lifting or straining until approved by your doctor. ° °a.You may drive when you are no longer taking prescription pain medication, you can comfortably wear a seatbelt, and you can safely maneuver your car and apply brakes. °b.RETURN TO WORK:   °_____________________________________________ ° °9.You should see your doctor in the office for a follow-up appointment approximately 2-3 weeks after your surgery.  Make sure that you call for this appointment within a day or two after you arrive home to insure a convenient appointment time. °10.OTHER INSTRUCTIONS: _________________________ °   _____________________________________ ° °WHEN TO CALL YOUR DOCTOR: °1. Fever over 101.0 °2. Inability to urinate °3. Nausea and/or vomiting °4. Extreme swelling or bruising °5. Continued bleeding from incision. °6. Increased pain, redness, or drainage from the incision ° °The clinic staff is available to answer your questions during regular business hours.  Please don’t hesitate to call and ask to speak to one of the nurses for clinical concerns.  If you have a medical emergency, go to the nearest emergency room or call 911.  A surgeon from [facility] is always on call at the hospital ° ° °[address], [address], [place], [place]  [address] ? ° [address] [address], [place], [place]   [address] °[phone] ? [phone] ? FAX [phone] °Web site: [email or web address] ° °

## 2017-12-06 NOTE — Anesthesia Procedure Notes (Signed)
Procedure Name: Intubation Date/Time: 12/06/2017 8:25 AM Performed by: Izora Gala, CRNA Pre-anesthesia Checklist: Patient identified, Emergency Drugs available, Suction available and Patient being monitored Patient Re-evaluated:Patient Re-evaluated prior to induction Oxygen Delivery Method: Circle system utilized Preoxygenation: Pre-oxygenation with 100% oxygen Induction Type: IV induction Ventilation: Mask ventilation without difficulty Laryngoscope Size: Miller and 3 Grade View: Grade I Tube type: Oral Tube size: 7.5 mm Number of attempts: 1 Airway Equipment and Method: Stylet Placement Confirmation: ETT inserted through vocal cords under direct vision and positive ETCO2 Secured at: 22 cm Tube secured with: Tape Dental Injury: Teeth and Oropharynx as per pre-operative assessment

## 2017-12-06 NOTE — Op Note (Signed)
12/06/2017  9:06 AM  PATIENT:  Katelyn Cohen  52 y.o. female  PRE-OPERATIVE DIAGNOSIS:  Incarcerated umbilical hernia  POST-OPERATIVE DIAGNOSIS: incarcerated umbilical hernia  PROCEDURE:  Procedure(s): LAPAROSCOPIC UMBILICAL HERNIA (N/A) INSERTION OF MESH (N/A)  SURGEON:  Surgeon(s) and Role:    * Ralene Ok, MD - Primary  PHYSICIAN ASSISTANT: Carlena Hurl, PA-C  ANESTHESIA:   local and general  EBL:  5 mL   BLOOD ADMINISTERED:none  DRAINS: none   LOCAL MEDICATIONS USED:  BUPIVICAINE   SPECIMEN:  Source of Specimen:  none  DISPOSITION OF SPECIMEN:  N/A  COUNTS:  YES  TOURNIQUET:  * No tourniquets in log *  DICTATION: .Dragon Dictation  Details of the procedure:   After the patient was consented patient was taken back to the operating room patient was then placed in supine position bilateral SCDs in place.  The patient was prepped and draped in the usual sterile fashion. After antibiotics were confirmed a timeout was called and all facts were verified. The Veress needle technique was used to insuflate the abdomen at Palmer's point. The abdomen was insufflated to 14 mm mercury. Subsequently a 5 mm trocar was placed a camera inserted there was no injury to any intra-abdominal organs.    There was seen to be an incarcerated  Umbilical 2cm hernia.  A second camera port was in placed into the left lower quadrant.   At this the Falicform ligament was taken down with Bovie cautery maintaining hemostasis. A 17mm port was placed in the epigastrium .   I proceeded to reduce the hernia contents.  Once the hernia was cleared away, a Bard Ventralight 15.2cm  mesh was inserted into the abdomen in the underlay fashion.  The mesh was secured circumferentially with am Securestrap tacker in a double crown fashion.    The omentum was brought over the area of the mesh. The pneumoperitoneum was evacuated  & all trocars  were removed. The skin was reapproximated with 4-0  Monocryl sutures  in a subcuticular fashion. The skin was dressed with Steri-Strips tape and gauze.  The patient was taken to the recovery room in stable condition.  My assistant was needed to help with maneuvering the camera while operating laparoscopically throughout the whole operation.  PLAN OF CARE: Discharge to home after PACU  PATIENT DISPOSITION:  PACU - hemodynamically stable.   Delay start of Pharmacological VTE agent (>24hrs) due to surgical blood loss or risk of bleeding: not applicable

## 2017-12-06 NOTE — Transfer of Care (Signed)
Immediate Anesthesia Transfer of Care Note  Patient: Katelyn Cohen  Procedure(s) Performed: LAPAROSCOPIC UMBILICAL HERNIA (N/A Abdomen) INSERTION OF MESH (N/A Abdomen)  Patient Location: PACU  Anesthesia Type:General  Level of Consciousness: awake and alert   Airway & Oxygen Therapy: Patient Spontanous Breathing and Patient connected to nasal cannula oxygen  Post-op Assessment: Report given to RN and Post -op Vital signs reviewed and stable  Post vital signs: Reviewed and stable  Last Vitals:  Vitals Value Taken Time  BP    Temp    Pulse    Resp    SpO2      Last Pain:  Vitals:   12/06/17 0735  TempSrc:   PainSc: 0-No pain         Complications: No apparent anesthesia complications

## 2017-12-07 ENCOUNTER — Encounter (HOSPITAL_COMMUNITY): Payer: Self-pay | Admitting: General Surgery

## 2017-12-07 NOTE — Anesthesia Postprocedure Evaluation (Signed)
Anesthesia Post Note  Patient: Katelyn Cohen  Procedure(s) Performed: LAPAROSCOPIC UMBILICAL HERNIA (N/A Abdomen) INSERTION OF MESH (N/A Abdomen)     Patient location during evaluation: PACU Anesthesia Type: General Level of consciousness: awake and alert Pain management: pain level controlled Vital Signs Assessment: post-procedure vital signs reviewed and stable Respiratory status: spontaneous breathing, nonlabored ventilation, respiratory function stable and patient connected to nasal cannula oxygen Cardiovascular status: blood pressure returned to baseline and stable Postop Assessment: no apparent nausea or vomiting Anesthetic complications: no    Last Vitals:  Vitals:   12/06/17 1150 12/06/17 1151  BP: 132/90 (!) 141/94  Pulse: 85 90  Resp: 18 17  Temp: 36.6 C   SpO2: 98% 100%    Last Pain:  Vitals:   12/06/17 1150  TempSrc:   PainSc: 0-No pain                 [name]

## 2017-12-15 ENCOUNTER — Other Ambulatory Visit: Payer: Self-pay

## 2017-12-15 ENCOUNTER — Emergency Department (HOSPITAL_COMMUNITY)
Admission: EM | Admit: 2017-12-15 | Discharge: 2017-12-16 | Disposition: A | Discharge status: Home / Self Care | Payer: BLUE CROSS/BLUE SHIELD | Attending: Emergency Medicine | Admitting: Emergency Medicine

## 2017-12-15 ENCOUNTER — Encounter (HOSPITAL_COMMUNITY): Payer: Self-pay | Admitting: Emergency Medicine

## 2017-12-15 DIAGNOSIS — K5641 Fecal impaction: Secondary | ICD-10-CM | POA: Insufficient documentation

## 2017-12-15 DIAGNOSIS — I10 Essential (primary) hypertension: Secondary | ICD-10-CM | POA: Insufficient documentation

## 2017-12-15 DIAGNOSIS — Z79899 Other long term (current) drug therapy: Secondary | ICD-10-CM | POA: Diagnosis not present

## 2017-12-15 DIAGNOSIS — R1084 Generalized abdominal pain: Secondary | ICD-10-CM | POA: Diagnosis not present

## 2017-12-15 DIAGNOSIS — J45909 Unspecified asthma, uncomplicated: Secondary | ICD-10-CM | POA: Diagnosis not present

## 2017-12-15 DIAGNOSIS — R109 Unspecified abdominal pain: Secondary | ICD-10-CM | POA: Diagnosis not present

## 2017-12-15 DIAGNOSIS — R110 Nausea: Secondary | ICD-10-CM | POA: Diagnosis not present

## 2017-12-15 LAB — CBC WITH DIFFERENTIAL/PLATELET
Abs Immature Granulocytes: 0.1 10*3/uL (ref 0.0–0.1)
Basophils Absolute: 0.1 10*3/uL (ref 0.0–0.1)
Basophils Relative: 1 %
Eosinophils Absolute: 0.1 10*3/uL (ref 0.0–0.7)
Eosinophils Relative: 1 %
HCT: 42.1 % (ref 36.0–46.0)
Hemoglobin: 13.8 g/dL (ref 12.0–15.0)
Immature Granulocytes: 1 %
Lymphocytes Relative: 17 %
Lymphs Abs: 2.4 10*3/uL (ref 0.7–4.0)
MCH: 27.3 pg (ref 26.0–34.0)
MCHC: 32.8 g/dL (ref 30.0–36.0)
MCV: 83.4 fL (ref 78.0–100.0)
Monocytes Absolute: 0.9 10*3/uL (ref 0.1–1.0)
Monocytes Relative: 6 %
Neutro Abs: 11 10*3/uL — ABNORMAL HIGH (ref 1.7–7.7)
Neutrophils Relative %: 74 %
Platelets: 518 10*3/uL — ABNORMAL HIGH (ref 150–400)
RBC: 5.05 MIL/uL (ref 3.87–5.11)
RDW: 12.9 % (ref 11.5–15.5)
WBC: 14.6 10*3/uL — ABNORMAL HIGH (ref 4.0–10.5)

## 2017-12-15 LAB — URINALYSIS, ROUTINE W REFLEX MICROSCOPIC
Bilirubin Urine: NEGATIVE
Glucose, UA: NEGATIVE mg/dL
Ketones, ur: NEGATIVE mg/dL
Leukocytes, UA: NEGATIVE
Nitrite: NEGATIVE
Protein, ur: NEGATIVE mg/dL
Specific Gravity, Urine: 1.016 (ref 1.005–1.030)
pH: 6 (ref 5.0–8.0)

## 2017-12-15 LAB — COMPREHENSIVE METABOLIC PANEL
ALT: 31 U/L (ref 14–54)
AST: 19 U/L (ref 15–41)
Albumin: 3.7 g/dL (ref 3.5–5.0)
Alkaline Phosphatase: 65 U/L (ref 38–126)
Anion gap: 9 (ref 5–15)
BUN: 11 mg/dL (ref 6–20)
CO2: 27 mmol/L (ref 22–32)
Calcium: 9.6 mg/dL (ref 8.9–10.3)
Chloride: 102 mmol/L (ref 101–111)
Creatinine, Ser: 1.01 mg/dL — ABNORMAL HIGH (ref 0.44–1.00)
GFR calc Af Amer: >60 mL/min (ref >60)
GFR calc non Af Amer: >60 mL/min (ref >60)
Glucose, Bld: 119 mg/dL — ABNORMAL HIGH (ref 65–99)
Potassium: 4 mmol/L (ref 3.5–5.1)
Sodium: 138 mmol/L (ref 135–145)
Total Bilirubin: 0.7 mg/dL (ref 0.3–1.2)
Total Protein: 8 g/dL (ref 6.5–8.1)

## 2017-12-15 LAB — LIPASE, BLOOD: Lipase: 20 U/L (ref 11–51)

## 2017-12-15 MED ORDER — FENTANYL CITRATE (PF) 100 MCG/2ML IJ SOLN
50.0000 ug | Freq: Once | INTRAMUSCULAR | Status: AC
  Administered 2017-12-15: 50 ug via INTRAVENOUS

## 2017-12-15 MED ORDER — ONDANSETRON HCL 4 MG/2ML IJ SOLN
4.0000 mg | Freq: Once | INTRAMUSCULAR | Status: AC
  Administered 2017-12-15: 4 mg via INTRAVENOUS
  Filled 2017-12-15: qty 2

## 2017-12-15 MED ORDER — ONDANSETRON 4 MG PO TBDP: 4.0000 mg | ORAL_TABLET | Freq: Once | ORAL | Status: DC

## 2017-12-15 MED ORDER — IOPAMIDOL (ISOVUE-300) INJECTION 61%
INTRAVENOUS | Status: AC
  Administered 2017-12-15: 09:00:00
  Filled 2017-12-15: qty 30

## 2017-12-15 MED ORDER — FENTANYL CITRATE (PF) 100 MCG/2ML IJ SOLN
50.0000 ug | Freq: Once | INTRAMUSCULAR | Status: DC
  Filled 2017-12-15: qty 2

## 2017-12-15 NOTE — ED Provider Notes (Signed)
Patient placed in Quick Look pathway, seen and evaluated   Chief Complaint: worsening abd pain x2 days  HPI:   Pt is a 53 y.o. female with a PMHx of HTN, lyme disease, asthma, kidney stones, and other medical conditions, presenting today with c/o b/l lateral abdominal pain x2 days.  Patient states that she had an umbilical hernia repair surgery on 12/06/2017, had been doing well however had not had a bowel movement since surgery, so she stopped taking narcotics on Wednesday, 2 days ago, however she began having lateral abdominal pain on both sides that felt different than her postop pain and has been gradually worsening. Chart review reveals that she had a laparoscopic umbilical hernia repair with mesh insertion on 12/06/17 by Dr. Rosendo Gros.  She states that she has not had a bowel movement since before surgery, and had been passing flatus until today when she stopped passing any flatus.  She states that her abdomen is distended, she has had nausea, as well as some chills.  She denies any fevers, vomiting, diarrhea, urinary symptoms, or any other complaints at this time.  ROS: +abd pain, +nausea, +constipation, +obstipation, +abd distension, +chills. No  fevers, vomiting, diarrhea, or urinary symptoms  Physical Exam:  BP (!) 143/111 (BP Location: Right Arm)   Pulse (!) 118   Temp 98.7 F (37.1 C) (Oral)   Resp 18   SpO2 100%    Gen: appears uncomfortable, preferring to stand, however in NAD. Mildly tachycardic likely from pain  Neuro: Awake and Alert  Skin: Warm    Focused Exam: Abd Soft, obese and perhaps slightly distended but hard to tell due to body habitus limiting this portion of the exam, with hypoactive BS throughout, with moderate lateral abd TTP bilaterally in upper and lower abdomen mostly on the lateral aspect of the abdomen, no r/g/r, neg murphy's, no focal mcburney's point TTP, no CVA TTP    Initiation of care has begun. The patient has been counseled on the process, plan, and necessity  for staying for the completion/evaluation, and the remainder of the medical screening examination     [address] [place], [place] 12/15/17 2102    Fatima Blank, MD 12/15/17 2238

## 2017-12-15 NOTE — ED Triage Notes (Signed)
Reports having umbilical hernia repair on May 8th.  Reports having new abdominal pain that is different and has not had a bowel movement since prior to surgery.  Also reports rectal pain.

## 2017-12-16 ENCOUNTER — Emergency Department (HOSPITAL_COMMUNITY): Payer: BLUE CROSS/BLUE SHIELD

## 2017-12-16 ENCOUNTER — Encounter (HOSPITAL_COMMUNITY): Payer: Self-pay | Admitting: Radiology

## 2017-12-16 DIAGNOSIS — R109 Unspecified abdominal pain: Secondary | ICD-10-CM | POA: Diagnosis not present

## 2017-12-16 MED ORDER — CALCIUM POLYCARBOPHIL 625 MG PO TABS: 625.0000 mg | ORAL_TABLET | Freq: Every day | ORAL | 0 refills | Status: DC

## 2017-12-16 MED ORDER — SORBITOL 70 % SOLN
960.0000 mL | TOPICAL_OIL | Freq: Once | ORAL | Status: AC
  Administered 2017-12-16: 960 mL via RECTAL
  Filled 2017-12-16: qty 473

## 2017-12-16 MED ORDER — SODIUM CHLORIDE 0.9 % IV BOLUS
1000.0000 mL | Freq: Once | INTRAVENOUS | Status: AC
  Administered 2017-12-16: 1000 mL via INTRAVENOUS

## 2017-12-16 MED ORDER — MORPHINE SULFATE (PF) 4 MG/ML IV SOLN
4.0000 mg | Freq: Once | INTRAVENOUS | Status: AC
  Administered 2017-12-16: 4 mg via INTRAVENOUS
  Filled 2017-12-16: qty 1

## 2017-12-16 MED ORDER — IOHEXOL 300 MG/ML  SOLN
100.0000 mL | Freq: Once | INTRAMUSCULAR | Status: AC | PRN
  Administered 2017-12-16: 100 mL via INTRAVENOUS

## 2017-12-16 MED ORDER — ONDANSETRON HCL 4 MG/2ML IJ SOLN
4.0000 mg | Freq: Once | INTRAMUSCULAR | Status: AC
  Administered 2017-12-16: 4 mg via INTRAVENOUS
  Filled 2017-12-16: qty 2

## 2017-12-16 MED ORDER — LIDOCAINE HCL URETHRAL/MUCOSAL 2 % EX GEL
1.0000 "application " | Freq: Once | CUTANEOUS | Status: AC
  Administered 2017-12-16: 1 via TOPICAL
  Filled 2017-12-16: qty 20

## 2017-12-16 MED ORDER — PEG 3350-KCL-NA BICARB-NACL 420 G PO SOLR
4000.0000 mL | Freq: Once | ORAL | Status: AC
  Administered 2017-12-16: 4000 mL via ORAL
  Filled 2017-12-16: qty 4000

## 2017-12-16 NOTE — ED Notes (Signed)
ED Provider at bedside. 

## 2017-12-16 NOTE — Discharge Instructions (Addendum)
°  Hydration: You should start drinking at least ten, 8 oz glasses of water a day to help relieve your constipation. Baseline hydration should be at least eight, 8 oz glasses of water a day. This is a daily amount that you should be drinking, even without your current issue. Proper hydration not only helps prevent constipation, it is essential for any of the following treatments to be effective.  GoLYTELY: Begin taking the GoLYTELY once at home and near a bathroom.   Fiber: Begin taking the fiber supplement daily. You should also increase the fiber in your diet.  Follow-up: Follow-up with your primary care provider as soon as possible for continued management of this issue.  Also follow-up with your general surgeon.  Return: Return to the ED should any symptoms worsen.  Pain management: Antiinflammatory medications: Take 600 mg of ibuprofen every 6 hours or 440 mg (over the counter dose) to 500 mg (prescription dose) of naproxen every 12 hours for the next 3 days. After this time, these medications may be used as needed for pain. Take these medications with food to avoid upset stomach. Choose only one of these medications, do not take them together. Tylenol: Should you continue to have additional pain while taking the ibuprofen or naproxen, you may add in tylenol as needed. Your daily total maximum amount of tylenol from all sources should be limited to 4000mg /day for persons without liver problems, or 2000mg /day for those with liver problems.

## 2017-12-16 NOTE — ED Notes (Signed)
Pharmacy called to get status on enema

## 2017-12-16 NOTE — ED Notes (Signed)
Pt only able to hold in small amount of enema, pt encouraged to hold it as long as she can. Pt only able to get a little amount of stool out with first BM

## 2017-12-16 NOTE — ED Provider Notes (Signed)
[facility] EMERGENCY DEPARTMENT Provider Note   CSN: [id number] Arrival date & time: 12/15/17  1952     History   Chief Complaint Chief Complaint  Patient presents with  . Abdominal Pain    HPI Katelyn Cohen is a 52 y.o. female.  HPI   Katelyn Cohen is a 52 y.o. female, with a history of recent ventral surgery repair and HTN, presenting to the ED with abdominal pain beginning May 14. Patient stopped taking her tramadol the same day. Most of her pain is rectal, 8/10, "it feels like I'm trying to have a baby." Also notes pain in the bilateral anterior flanks. Notes she had umbilical hernia repair performed 5/8 by Dr. Rosendo Gros. Has not had a BM since before surgery. No flatus in 2 days. Accompanied by nausea.  Denies fever, vomiting, diarrhea, hematochezia/melena, or any other complaints.   Past Medical History:  Diagnosis Date  . Arthritis   . Asthma   . Bladder incontinence    cant hold urine  . Claustrophobia   . Complication of anesthesia    facial itching,"hot spot"  . History of kidney stones   . Hypertension   . Lyme disease 5/14  . Menorrhagia with irregular cycle   . Sleep apnea    cpap    Patient Active Problem List   Diagnosis Date Noted  . Fibroids, subserous 03/31/2016  . Adiposity 11/20/2015  . Menorrhagia with irregular cycle   . HYPERTENSION 04/22/2010  . ALLERGIC RHINITIS 04/22/2010  . COUGH 04/22/2010    Past Surgical History:  Procedure Laterality Date  . CESAREAN SECTION    . INSERTION OF MESH N/A 12/06/2017   Procedure: INSERTION OF MESH;  Surgeon: Ralene Ok, MD;  Location: [facility];  Service: General;  Laterality: N/A;  . REPEAT CESAREAN SECTION    . UMBILICAL HERNIA REPAIR N/A 12/06/2017   Procedure: LAPAROSCOPIC UMBILICAL HERNIA;  Surgeon: Ralene Ok, MD;  Location: [facility];  Service: General;  Laterality: N/A;     OB History    Gravida  4   Para  2   Term  2   Preterm      AB  2   Living  2     SAB  2   TAB      Ectopic      Multiple      Live Births               Home Medications    Prior to Admission medications   Medication Sig Start Date End Date Taking? Authorizing Provider  benzonatate (TESSALON) 100 MG capsule Take 100 mg by mouth 3 (three) times daily as needed for cough.  09/15/15   [provider]  diclofenac sodium (VOLTAREN) 1 % GEL Apply 2 g topically 4 (four) times daily as needed (pain).    [provider]  hydrochlorothiazide (HYDRODIURIL) 25 MG tablet Take 25 mg by mouth daily. 11/02/15   [provider]  ipratropium-albuterol (DUONEB) 0.5-2.5 (3) MG/3ML SOLN Take 3 mLs by nebulization every 6 (six) hours as needed (wheezing or shortness of breath).    [provider]  loratadine (CLARITIN) 10 MG tablet Take 10 mg by mouth daily as needed for allergies.    [provider]  losartan (COZAAR) 100 MG tablet Take 1 tablet (100 mg total) by mouth daily. 09/03/13   Megan Salon, MD  meloxicam (MOBIC) 15 MG tablet Take 1 tablet by mouth daily.  10/28/15   [provider]  methocarbamol (ROBAXIN) 500 MG tablet Take 500 mg by mouth as needed for muscle spasms.    [provider]  naproxen (NAPROSYN) 500 MG tablet Take 500 mg by mouth daily.    [provider]  polycarbophil (FIBERCON) 625 MG tablet Take 1 tablet (625 mg total) by mouth daily. 12/16/17   Joy, Shawn C, PA-C  PROAIR HFA 108 (90 Base) MCG/ACT inhaler Inhale 1-2 puffs into the lungs every 6 (six) hours as needed for wheezing or shortness of breath.  09/02/15   [provider]  traMADol (ULTRAM) 50 MG tablet TAKE 1 TO 2 TABLETS BY MOUTH EVERY 6 TO 8 HOURS AS NEEDED FOR PAIN 09/22/16   [provider]  traMADol (ULTRAM) 50 MG tablet Take 1 tablet (50 mg total) by mouth every 6 (six) hours as needed. 12/06/17 12/06/18  Ralene Ok, MD  Trospium Chloride 60 MG CP24 Take 1 capsule (60 mg total) by mouth daily. 11/21/17    Salvadore Dom, MD  Vitamin D, Ergocalciferol, (DRISDOL) 50000 units CAPS capsule TAKE ONE CAPSULE BY MOUTH ONCE WEEKLY 12/05/16   Megan Salon, MD    Family History Family History  Problem Relation Age of Onset  . Liver disease Mother        nonalcoholic fatty liver disease  . Hepatitis C Mother   . Cancer Father        lung cancer  . Breast cancer Maternal Grandmother     Social History Social History   Tobacco Use  . Smoking status: Never Smoker  . Smokeless tobacco: Never Used  Substance Use Topics  . Alcohol use: No  . Drug use: No     Allergies   Ace inhibitors; Penicillins; and Hydrocodone   Review of Systems Review of Systems  Constitutional: Positive for chills and diaphoresis. Negative for fever.  Respiratory: Negative for shortness of breath.   Cardiovascular: Negative for chest pain.  Gastrointestinal: Positive for abdominal pain, constipation and nausea. Negative for blood in stool, diarrhea and vomiting.  All other systems reviewed and are negative.    Physical Exam Updated Vital Signs BP (!) 138/92 (BP Location: Right Arm)   Pulse (!) 113   Temp 98.2 F (36.8 C) (Oral)   Resp 16   Ht 5\' 7"  (1.702 m)   Wt 129.7 kg (286 lb)   LMP 12/06/2017   SpO2 98%   BMI 44.79 kg/m   Physical Exam  Constitutional: She appears well-developed and well-nourished. No distress.  HENT:  Head: Normocephalic and atraumatic.  Eyes: Conjunctivae are normal.  Neck: Neck supple.  Cardiovascular: Regular rhythm, normal heart sounds and intact distal pulses. Tachycardia present.  Pulmonary/Chest: Effort normal and breath sounds normal. No respiratory distress.  Abdominal: Soft. Bowel sounds are decreased. There is tenderness. There is no guarding.    Genitourinary:  Genitourinary Comments: No external hemorrhoids, fissures, or lesions noted.  Large amount of soft stool burden in the rectal vault. No gross blood. RN, Lovena Le, served as chaperone during the  rectal exam.  Musculoskeletal: She exhibits no edema.  Lymphadenopathy:    She has no cervical adenopathy.  Neurological: She is alert.  Skin: Skin is warm and dry. She is not diaphoretic.  Psychiatric: She has a normal mood and affect. Her behavior is normal.  Nursing note and vitals reviewed.    ED Treatments / Results  Labs (all labs ordered are listed, but only abnormal results are displayed) Labs Reviewed  CBC WITH DIFFERENTIAL/PLATELET -  Abnormal; Notable for the following components:      Result Value   WBC 14.6 (*)    Platelets 518 (*)    Neutro Abs 11.0 (*)    All other components within normal limits  COMPREHENSIVE METABOLIC PANEL - Abnormal; Notable for the following components:   Glucose, Bld 119 (*)    Creatinine, Ser 1.01 (*)    All other components within normal limits  URINALYSIS, ROUTINE W REFLEX MICROSCOPIC - Abnormal; Notable for the following components:   APPearance HAZY (*)    Hgb urine dipstick SMALL (*)    Bacteria, UA RARE (*)    All other components within normal limits  LIPASE, BLOOD    EKG None  Radiology Ct Abdomen Pelvis W Contrast  Result Date: 12/16/2017 CLINICAL DATA:  Initial evaluation for acute abdominal pain status post recent surgery for umbilical hernia. EXAM: CT ABDOMEN AND PELVIS WITH CONTRAST TECHNIQUE: Multidetector CT imaging of the abdomen and pelvis was performed using the standard protocol following bolus administration of intravenous contrast. CONTRAST:  131mL OMNIPAQUE IOHEXOL 300 MG/ML  SOLN COMPARISON:  None available. FINDINGS: Lower chest: Minimal scattered subsegmental atelectatic changes present within the visualized lung bases. Visualized lungs are otherwise clear. Hepatobiliary: 12 mm cyst present within the left hepatic lobe. Liver otherwise unremarkable and demonstrates a normal contrast enhanced appearance. Gallbladder within normal limits. No biliary dilatation. Pancreas: Pancreas within normal limits. Spleen:  Unremarkable. Adrenals/Urinary Tract: Adrenal glands are normal. Kidneys equal size with symmetric enhancement. No nephrolithiasis, hydronephrosis, or focal enhancing renal mass. No hydroureter. Partially distended bladder within normal limits. Stomach/Bowel: Stomach within normal limits. No evidence for bowel obstruction. Appendix normal. No acute inflammatory changes seen about the bowels. Moderate stool impacted within the rectal vault, suggesting constipation. Vascular/Lymphatic: Normal intravascular enhancement seen throughout the intra-abdominal aorta. Mesenteric vessels are patent proximally. Retroaortic left renal vein noted. No adenopathy. Reproductive: Uterus is enlarged with scattered fibroids present, largest of which measures approximately 5.5 cm. Ovaries within normal limits. Other: Postoperative changes from recent umbilical hernia repair seen within the ventral abdomen. Small postoperative collection seen along the undersurface of the hernia mesh measures 6.5 x 1.2 cm (series 3, image 59). Postoperative stranding seen within the adjacent omental fat, with evidence of adjacent fat necrosis/omental infarct (series 3, image 64). Additional 3.6 x 3.5 x 4.3 cm loculated collection along the umbilicus (series 3, image 55). No free intraperitoneal air. No other free fluid within the abdomen and pelvis. Musculoskeletal: No acute osseous abnormality. No worrisome lytic or blastic osseous lesions. IMPRESSION: 1. Postoperative changes from recent ventral hernia repair with small collections along the hernia mesh as well as the overlying umbilicus as above, felt to be most consistent with a postoperative seromas. Evidence for fat necrosis within the adjacent omental fat. 2. No other acute intra-abdominal or pelvic process. 3. Moderate stool impacted within the rectal vault, suggesting constipation. 4. Fibroid uterus. Electronically Signed   By: Jeannine Boga M.D.   On: 12/16/2017 01:10     Procedures Fecal disimpaction Date/Time: 12/16/2017 9:40 AM Performed by: Lorayne Bender, PA-C Authorized by: Lorayne Bender, PA-C  Consent: Verbal consent obtained. Risks and benefits: risks, benefits and alternatives were discussed Consent given by: patient Patient understanding: patient states understanding of the procedure being performed Patient identity confirmed: verbally with patient and provided demographic data Local anesthesia used: yes Anesthesia: see MAR for details  Anesthesia: Local anesthesia used: yes  Sedation: Patient sedated: no  Patient tolerance: Patient tolerated the procedure  well with no immediate complications    (including critical care time)  Medications Ordered in ED Medications  iopamidol (ISOVUE-300) 61 % injection (  Contrast Given 12/15/17 0915)  fentaNYL (SUBLIMAZE) injection 50 mcg (50 mcg Intravenous Given 12/15/17 2121)  ondansetron (ZOFRAN) injection 4 mg (4 mg Intravenous Given 12/15/17 2120)  iohexol (OMNIPAQUE) 300 MG/ML solution 100 mL (100 mLs Intravenous Contrast Given 12/16/17 0010)  sodium chloride 0.9 % bolus 1,000 mL (0 mLs Intravenous Stopped 12/16/17 1234)  morphine 4 MG/ML injection 4 mg (4 mg Intravenous Given 12/16/17 0844)  ondansetron (ZOFRAN) injection 4 mg (4 mg Intravenous Given 12/16/17 0844)  lidocaine (XYLOCAINE) 2 % jelly 1 application (1 application Topical Given by Other 12/16/17 0935)  sorbitol, milk of mag, mineral oil, glycerin (SMOG) enema (960 mLs Rectal Given 12/16/17 1138)  ondansetron (ZOFRAN) injection 4 mg (4 mg Intravenous Given 12/16/17 1211)  morphine 4 MG/ML injection 4 mg (4 mg Intravenous Given 12/16/17 1234)  polyethylene glycol-electrolytes (NuLYTELY/GoLYTELY) solution 4,000 mL (4,000 mLs Oral Given 12/16/17 1411)     Initial Impression / Assessment and Plan / ED Course  I have reviewed the triage vital signs and the nursing notes.  Pertinent labs & imaging results that were available during my care of  the patient were reviewed by me and considered in my medical decision making (see chart for details).  Clinical Course as of Dec 16 1420  Sat Dec 16, 2017  0842 Spoke with Dr. Grandville Silos, general surgeon.  States CT findings, other than the rectal stool impaction, are as expected postop. Should manual disimpaction fail, may try SMOG enema.  Follow up with Dr. Rosendo Gros in the office.   [SJ]    Clinical Course User Index [SJ] Joy, Shawn C, PA-C    Patient presents with abdominal pain.  Moderate rectal stool burden noted on CT. I attempted manual disimpaction with minimal success. SMOG edema attempted with minimal success. Patient is not vomiting and is tolerating PO. She is nontoxic appearing. Patient given the option for initiation of GoLYTELY here in the ED or at home.  Patient opted to take this home with her. The patient was given instructions for home care as well as return precautions. Patient voices understanding of these instructions, accepts the plan, and is comfortable with discharge.    Findings and plan of care discussed with Lacretia Leigh, MD.   Vitals:   12/16/17 0044 12/16/17 0450 12/16/17 0724 12/16/17 1052  BP: (!) 132/94 (!) 135/91 (!) 138/92 122/77  Pulse: (!) 110 (!) 108 (!) 113 85  Resp: 18 16 16 18   Temp: 99 F (37.2 C) 99.1 F (37.3 C) 98.2 F (36.8 C)   TempSrc: Oral Oral Oral   SpO2: 93% 96% 98% 100%  Weight:      Height:          Final Clinical Impressions(s) / ED Diagnoses   Final diagnoses:  Generalized abdominal pain  Fecal impaction [facility])    ED Discharge Orders        Ordered    polycarbophil (FIBERCON) 625 MG tablet  Daily     12/16/17 [place], Shawn C, PA-C 12/16/17 1422    Lacretia Leigh, MD 12/17/17 [id number]

## 2018-01-06 DIAGNOSIS — G4733 Obstructive sleep apnea (adult) (pediatric): Secondary | ICD-10-CM | POA: Diagnosis not present

## 2018-02-05 DIAGNOSIS — G4733 Obstructive sleep apnea (adult) (pediatric): Secondary | ICD-10-CM | POA: Diagnosis not present

## 2018-02-08 ENCOUNTER — Other Ambulatory Visit: Payer: Self-pay

## 2018-02-08 ENCOUNTER — Ambulatory Visit (INDEPENDENT_AMBULATORY_CARE_PROVIDER_SITE_OTHER): Payer: BLUE CROSS/BLUE SHIELD | Admitting: Obstetrics & Gynecology

## 2018-02-08 ENCOUNTER — Encounter

## 2018-02-08 ENCOUNTER — Encounter: Payer: Self-pay | Admitting: Obstetrics & Gynecology

## 2018-02-08 VITALS — BP 130/80 | HR 88 | Resp 18 | Ht 65.5 in | Wt 284.8 lb

## 2018-02-08 DIAGNOSIS — Z124 Encounter for screening for malignant neoplasm of cervix: Secondary | ICD-10-CM

## 2018-02-08 DIAGNOSIS — Z01419 Encounter for gynecological examination (general) (routine) without abnormal findings: Secondary | ICD-10-CM | POA: Diagnosis not present

## 2018-02-08 MED ORDER — TRIAMCINOLONE ACETONIDE 0.5 % EX OINT: 1.0000 "application " | TOPICAL_OINTMENT | Freq: Two times a day (BID) | CUTANEOUS | 0 refills | Status: DC

## 2018-02-08 MED ORDER — HYDROCHLOROTHIAZIDE 25 MG PO TABS: 25.0000 mg | ORAL_TABLET | Freq: Every day | ORAL | 4 refills | Status: DC

## 2018-02-08 MED ORDER — TROSPIUM CHLORIDE ER 60 MG PO CP24: 1.0000 | ORAL_CAPSULE | Freq: Every day | ORAL | 4 refills | Status: DC

## 2018-02-08 NOTE — Progress Notes (Signed)
52 y.o. [id number] MarriedCaucasianF here for annual exam.  Doing well.  Older daughter has a 79 month old and is expecting again in January.  Younger daughter has a 87 year old and moved back from [place].    Had umbilical hernia repaired in May.  Had a lot of issues with constipation post op.  Now has hemorrhoids.  Would like to see surgeon about possible removal.  Bothersome almost all of the time.  Menstrual cycles are much decreased.  Has only had two cycles this year.  This most recent one lasted four days and flow was heavy but this is much better than in the past.    Patient's last menstrual period was 01/06/2018.          Sexually active: Yes.    The current method of family planning is vasectomy.    Exercising: Yes.    water walking 3 x weekly  Smoker:  no  Health Maintenance: Pap:  12/05/16 Neg   11/20/15 Neg. HR HPV:neg  History of abnormal Pap:  no MMG:  03/28/16 BIRADS1:Neg.  Can't get this done right now because she can't raise her arm due shoulder issues Colonoscopy:  Never BMD:   Never TDaP:  2018 Pneumonia vaccine(s):  n/a Shingrix:   D/w pt shingrix vaccination. Hep C testing: n/a Screening Labs: PCP   reports that she has never smoked. She has never used smokeless tobacco. She reports that she does not drink alcohol or use drugs.  Past Medical History:  Diagnosis Date  . Arthritis   . Asthma   . Bladder incontinence    cant hold urine  . Claustrophobia   . Complication of anesthesia    facial itching,"hot spot"  . History of kidney stones   . Hypertension   . Lyme disease 5/14  . Menorrhagia with irregular cycle   . Sleep apnea    cpap    Past Surgical History:  Procedure Laterality Date  . CESAREAN SECTION    . INSERTION OF MESH N/A 12/06/2017   Procedure: INSERTION OF MESH;  Surgeon: Ralene Ok, MD;  Location: [facility];  Service: General;  Laterality: N/A;  . REPEAT CESAREAN SECTION    . UMBILICAL HERNIA REPAIR N/A 12/06/2017   Procedure: LAPAROSCOPIC  UMBILICAL HERNIA;  Surgeon: Ralene Ok, MD;  Location: [facility];  Service: General;  Laterality: N/A;    Current Outpatient Medications  Medication Sig Dispense Refill  . diclofenac sodium (VOLTAREN) 1 % GEL Apply 2 g topically 4 (four) times daily as needed (pain).    . hydrochlorothiazide (HYDRODIURIL) 25 MG tablet Take 25 mg by mouth daily.  3  . ipratropium-albuterol (DUONEB) 0.5-2.5 (3) MG/3ML SOLN Take 3 mLs by nebulization every 6 (six) hours as needed (wheezing or shortness of breath).    . loratadine (CLARITIN) 10 MG tablet Take 10 mg by mouth daily as needed for allergies.    Marland Kitchen losartan (COZAAR) 100 MG tablet Take 1 tablet (100 mg total) by mouth daily. 90 tablet 4  . meloxicam (MOBIC) 15 MG tablet Take 1 tablet by mouth daily.   2  . methocarbamol (ROBAXIN) 500 MG tablet Take 500 mg by mouth as needed for muscle spasms.    . naproxen (NAPROSYN) 500 MG tablet Take 500 mg by mouth daily.    . polycarbophil (FIBERCON) 625 MG tablet Take 1 tablet (625 mg total) by mouth daily. 15 tablet 0  . PROAIR HFA 108 (90 Base) MCG/ACT inhaler Inhale 1-2 puffs into the lungs  every 6 (six) hours as needed for wheezing or shortness of breath.   0  . traMADol (ULTRAM) 50 MG tablet TAKE 1 TO 2 TABLETS BY MOUTH EVERY 6 TO 8 HOURS AS NEEDED FOR PAIN  0  . Trospium Chloride 60 MG CP24 Take 1 capsule (60 mg total) by mouth daily. 90 capsule 0  . Vitamin D, Ergocalciferol, (DRISDOL) 50000 units CAPS capsule TAKE ONE CAPSULE BY MOUTH ONCE WEEKLY 12 capsule 4  . benzonatate (TESSALON) 100 MG capsule Take 100 mg by mouth 3 (three) times daily as needed for cough.   1   No current facility-administered medications for this visit.     Family History  Problem Relation Age of Onset  . Liver disease Mother        nonalcoholic fatty liver disease  . Hepatitis C Mother   . Cancer Father        lung cancer  . Breast cancer Maternal Grandmother     Review of Systems  Genitourinary:       Vulvar/vaginal  litching  Neurological: Positive for headaches.  All other systems reviewed and are negative.   Exam:   BP 130/80 (BP Location: Right Arm, Patient Position: Sitting, Cuff Size: Large)   Pulse 88   Resp 18   Ht 5' 5.5" (1.664 m)   Wt 284 lb 12.8 oz (129.2 kg)   LMP 01/06/2018   BMI 46.67 kg/m   Height: -36#   Height: 5' 5.5" (166.4 cm)  Ht Readings from Last 3 Encounters:  02/08/18 5' 5.5" (1.664 m)  12/15/17 5\' 7"  (1.702 m)  11/28/17 5\' 7"  (1.702 m)   General appearance: alert, cooperative and appears stated age Head: Normocephalic, without obvious abnormality, atraumatic Neck: no adenopathy, supple, symmetrical, trachea midline and thyroid normal to inspection and palpation Lungs: clear to auscultation bilaterally Breasts: normal appearance, no masses or tenderness Heart: regular rate and rhythm Abdomen: soft, non-tender; bowel sounds normal; no masses,  no organomegaly Extremities: extremities normal, atraumatic, no cyanosis or edema Skin: Skin color, texture, turgor normal. No rashes or lesions Lymph nodes: Cervical, supraclavicular, and axillary nodes normal. No abnormal inguinal nodes palpated Neurologic: Grossly normal   Pelvic: External genitalia:  no lesions              Urethra:  normal appearing urethra with no masses, tenderness or lesions              Bartholins and Skenes: normal                 Vagina: normal appearing vagina with normal color and discharge, no lesions              Cervix: no lesions              Pap taken: Yes.  Held today for pt to check  Bimanual Exam:  Uterus:  normal size, contour, position, consistency, mobility, non-tender              Adnexa: normal adnexa and no mass, fullness, tenderness               Rectovaginal: Confirms               Anus:  normal sphincter tone, external hemorrhoids  Chaperone was present for exam.  A:  Well Woman with normal exam Perimenopausal bleeding H/O enlarged uterus without fibroids with much  improved bleeding this past year Hypertension Purposeful weight loss Morbid obesity  P:   Mammogram guidelines  reviewed pap smear obtained. Referral to Dr. Marcello Moores for consideration of hemorrhoid removal Tropsium 60mg  daily Triamcinolone 0.5% ointment bid for up to 7 days HCTZ 25mg  daily.  #90/4RF return annually or prn

## 2018-02-13 ENCOUNTER — Other Ambulatory Visit (HOSPITAL_COMMUNITY)
Admission: RE | Admit: 2018-02-13 | Discharge: 2018-02-13 | Disposition: A | Discharge status: Home / Self Care | Payer: BLUE CROSS/BLUE SHIELD | Source: Ambulatory Visit | Attending: Obstetrics & Gynecology | Admitting: Obstetrics & Gynecology

## 2018-02-13 DIAGNOSIS — Z124 Encounter for screening for malignant neoplasm of cervix: Secondary | ICD-10-CM | POA: Diagnosis not present

## 2018-02-14 ENCOUNTER — Other Ambulatory Visit: Payer: Self-pay | Admitting: *Deleted

## 2018-02-14 DIAGNOSIS — K649 Unspecified hemorrhoids: Secondary | ICD-10-CM

## 2018-02-15 LAB — CYTOLOGY - PAP: Diagnosis: NEGATIVE

## 2018-02-16 ENCOUNTER — Telehealth: Payer: Self-pay | Admitting: Obstetrics & Gynecology

## 2018-02-16 NOTE — Telephone Encounter (Signed)
°  Left voicemail regarding referral appointment. The information is listed below. Should the patient need to cancel or reschedule this appointment, Please advise them to call the office they've been referred to in order to reschedule.   Dr. Leighton Ruff 23/00/97 @ 3:40 pm. Please arrive 15 minutes early and bring your insurance card and photo id and list of medications.  [facility] (Main Office) [phone] [address] [place], [place] [address] [phone]

## 2018-02-20 NOTE — Telephone Encounter (Signed)
Call placed to convey appointment information

## 2018-03-05 DIAGNOSIS — K644 Residual hemorrhoidal skin tags: Secondary | ICD-10-CM | POA: Diagnosis not present

## 2018-03-08 DIAGNOSIS — G4733 Obstructive sleep apnea (adult) (pediatric): Secondary | ICD-10-CM | POA: Diagnosis not present

## 2018-03-27 ENCOUNTER — Other Ambulatory Visit: Payer: Self-pay | Admitting: Obstetrics & Gynecology

## 2018-03-27 DIAGNOSIS — Z1231 Encounter for screening mammogram for malignant neoplasm of breast: Secondary | ICD-10-CM

## 2018-04-24 ENCOUNTER — Ambulatory Visit
Admission: RE | Admit: 2018-04-24 | Discharge: 2018-04-24 | Disposition: A | Discharge status: Home / Self Care | Payer: BLUE CROSS/BLUE SHIELD | Source: Ambulatory Visit | Attending: Obstetrics & Gynecology | Admitting: Obstetrics & Gynecology

## 2018-04-24 DIAGNOSIS — Z1231 Encounter for screening mammogram for malignant neoplasm of breast: Secondary | ICD-10-CM | POA: Diagnosis not present

## 2018-04-30 DIAGNOSIS — Z23 Encounter for immunization: Secondary | ICD-10-CM | POA: Diagnosis not present

## 2018-05-07 DIAGNOSIS — R1084 Generalized abdominal pain: Secondary | ICD-10-CM | POA: Diagnosis not present

## 2018-09-21 DIAGNOSIS — J209 Acute bronchitis, unspecified: Secondary | ICD-10-CM | POA: Diagnosis not present

## 2018-09-21 DIAGNOSIS — J0100 Acute maxillary sinusitis, unspecified: Secondary | ICD-10-CM | POA: Diagnosis not present

## 2018-09-21 DIAGNOSIS — R05 Cough: Secondary | ICD-10-CM | POA: Diagnosis not present

## 2018-09-21 DIAGNOSIS — G4733 Obstructive sleep apnea (adult) (pediatric): Secondary | ICD-10-CM | POA: Diagnosis not present

## 2018-12-14 DIAGNOSIS — J45909 Unspecified asthma, uncomplicated: Secondary | ICD-10-CM | POA: Diagnosis not present

## 2018-12-14 DIAGNOSIS — I10 Essential (primary) hypertension: Secondary | ICD-10-CM | POA: Diagnosis not present

## 2018-12-14 DIAGNOSIS — G479 Sleep disorder, unspecified: Secondary | ICD-10-CM | POA: Diagnosis not present

## 2018-12-14 DIAGNOSIS — Z6841 Body Mass Index (BMI) 40.0 and over, adult: Secondary | ICD-10-CM | POA: Diagnosis not present

## 2019-01-17 DIAGNOSIS — Z20828 Contact with and (suspected) exposure to other viral communicable diseases: Secondary | ICD-10-CM | POA: Diagnosis not present

## 2019-03-20 IMAGING — CT CT ABD-PELV W/ CM
2 of 5 series · 14 of 46 positions shown, 16 images · IV contrast (APPLIED)
Comparison: None available.

CLINICAL DATA: Initial evaluation for acute abdominal pain status
post recent surgery for umbilical hernia.

EXAM:
CT ABDOMEN AND PELVIS WITH CONTRAST
TECHNIQUE: Multidetector CT imaging of the abdomen and pelvis was performed
using the standard protocol following bolus administration of
intravenous contrast.
CONTRAST:  100mL OMNIPAQUE IOHEXOL 300 MG/ML  SOLN

[Series 3: abdomen 5.0 · axial · 0.98mm/px · z∈[+818,+1258]mm · 11 of 106 slices shown, 13 images]
[im 9/106  soft-tissue]
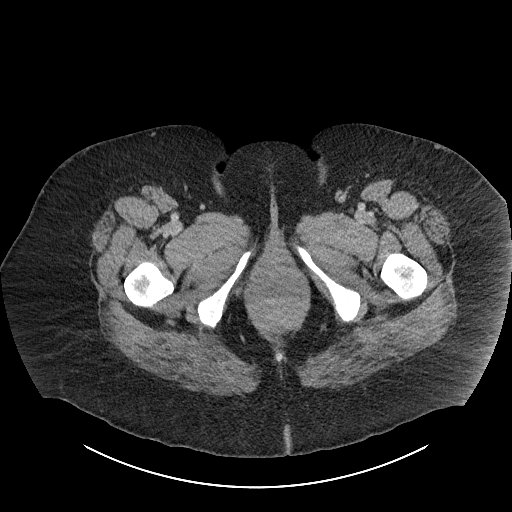
[im 9/106  bone]
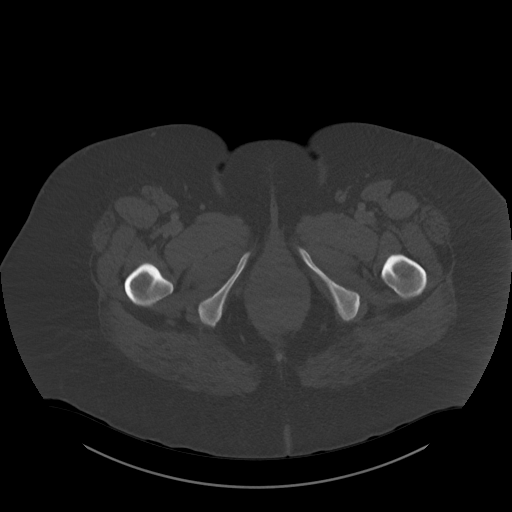
[im 18/106  soft-tissue]
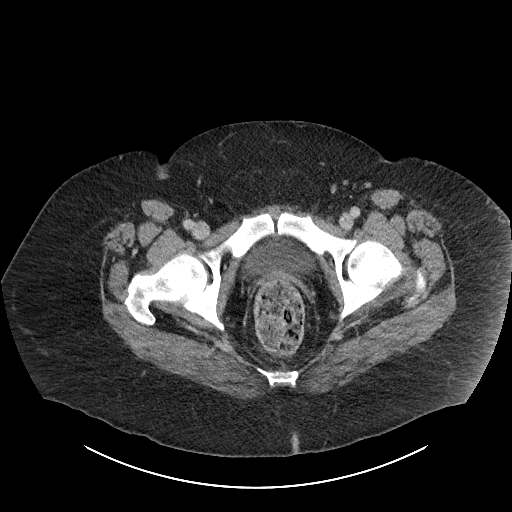
[im 27/106  soft-tissue]
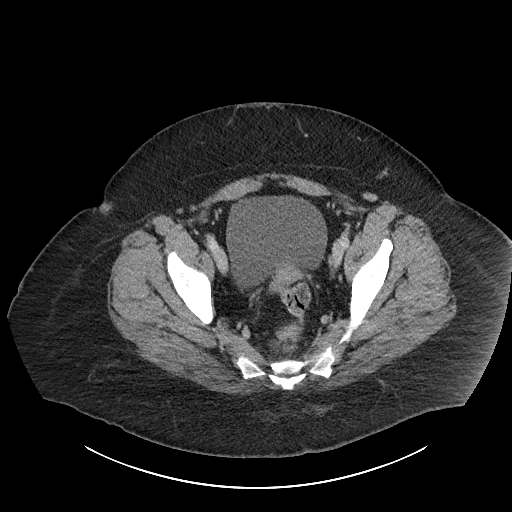
[im 36/106  soft-tissue]
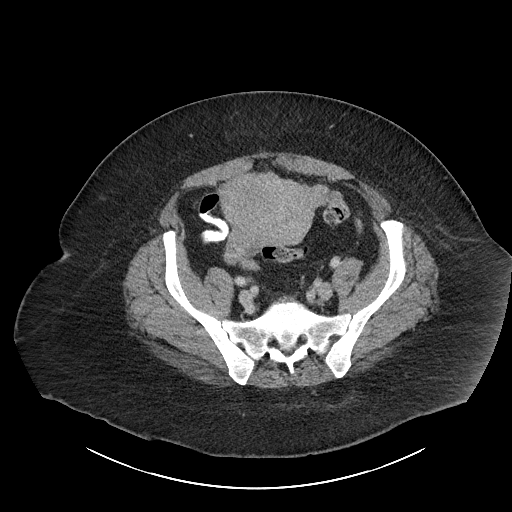
[im 44/106  soft-tissue]
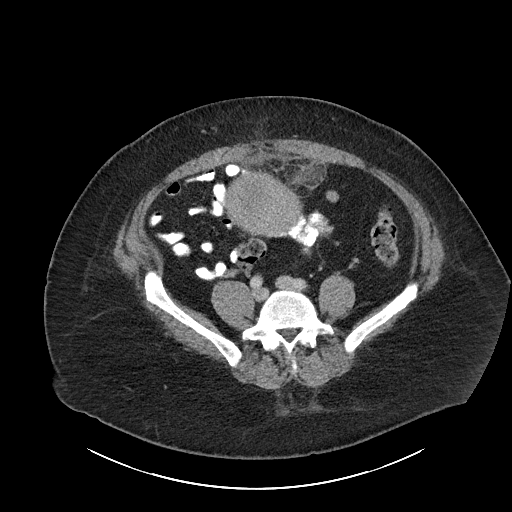
[im 53/106  soft-tissue]
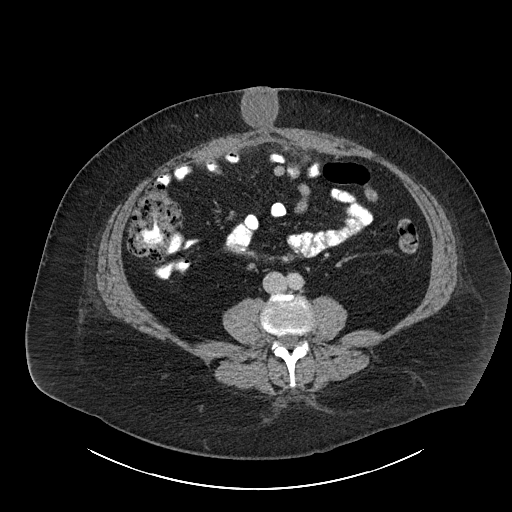
[im 62/106  soft-tissue]
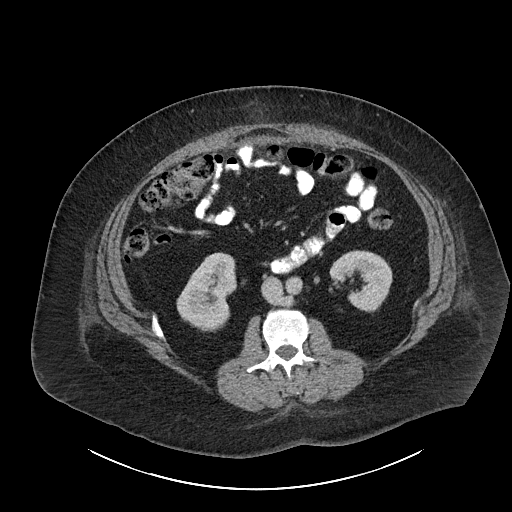
[im 71/106  soft-tissue]
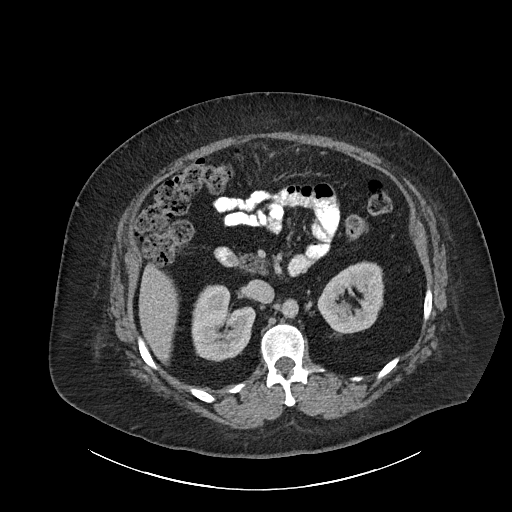
[im 79/106  soft-tissue]
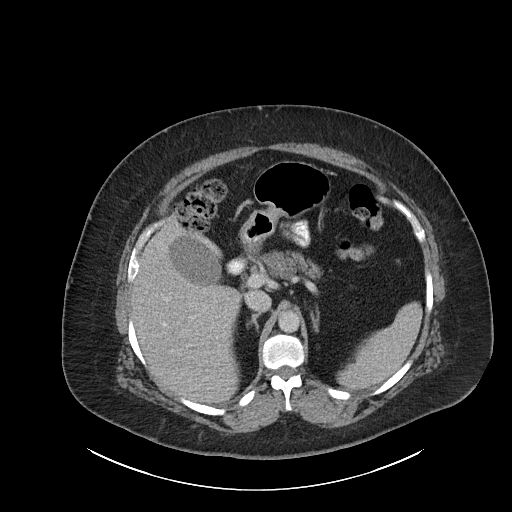
[im 79/106  bone]
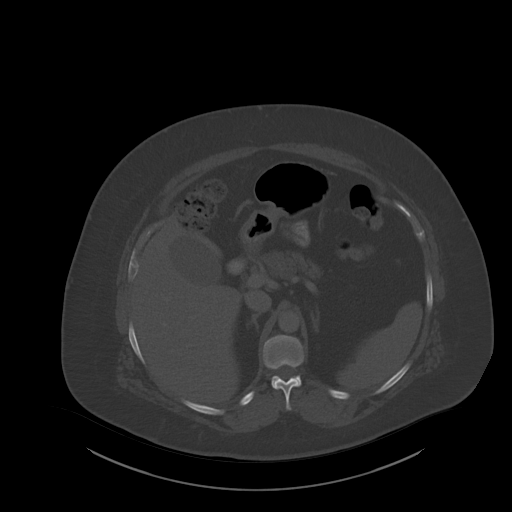
[im 88/106  soft-tissue]
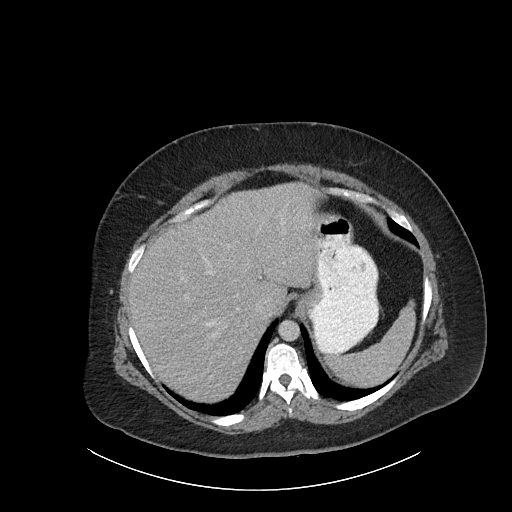
[im 97/106  soft-tissue]
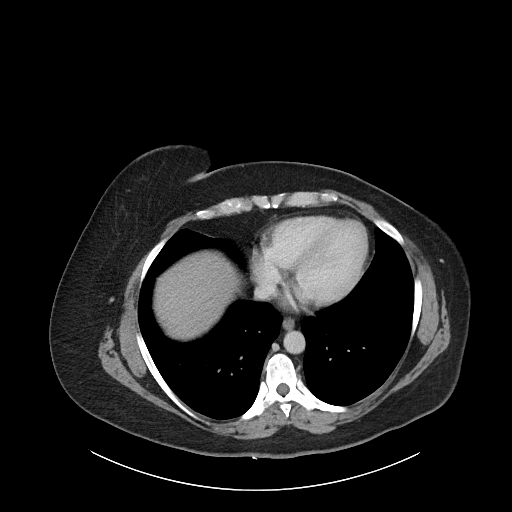

[Series 6: abdomen 3.0 mpr cor · coronal · 0.70mm/px · 3 of 123 slices shown]
[im 41/123  soft-tissue]
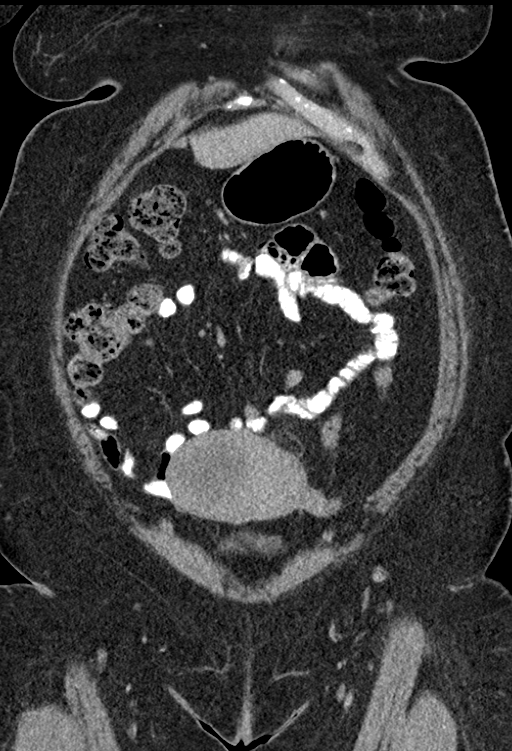
[im 55/123  soft-tissue]
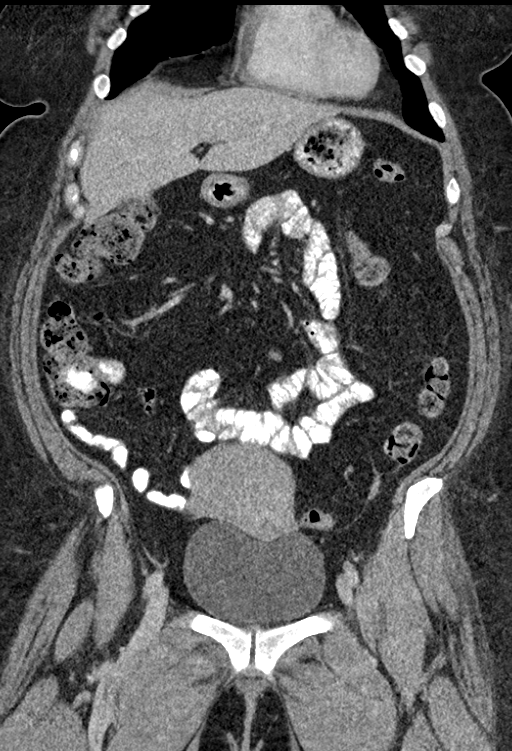
[im 68/123  soft-tissue]
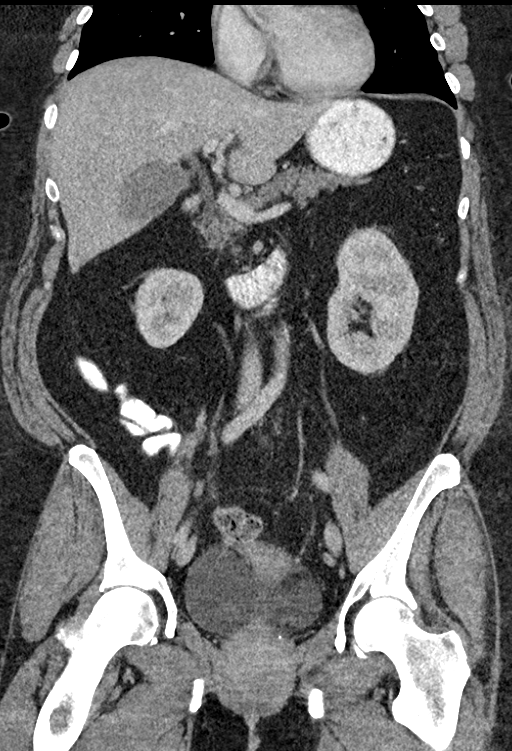

[14 of 46 positions shown; findings below may reference images not displayed]

FINDINGS: Lower chest: Minimal scattered subsegmental atelectatic changes
present within the visualized lung bases. Visualized lungs are
otherwise clear.

Hepatobiliary: 12 mm cyst present within the left hepatic lobe.
Liver otherwise unremarkable and demonstrates a normal contrast
enhanced appearance. Gallbladder within normal limits. No biliary
dilatation.

Pancreas: Pancreas within normal limits.

Spleen: Unremarkable.

Adrenals/Urinary Tract: Adrenal glands are normal. Kidneys equal
size with symmetric enhancement. No nephrolithiasis, hydronephrosis,
or focal enhancing renal mass. No hydroureter. Partially distended
bladder within normal limits.

Stomach/Bowel: Stomach within normal limits. No evidence for bowel
obstruction. Appendix normal. No acute inflammatory changes seen
about the bowels. Moderate stool impacted within the rectal vault,
suggesting constipation.

Vascular/Lymphatic: Normal intravascular enhancement seen throughout
the intra-abdominal aorta. Mesenteric vessels are patent proximally.
Retroaortic left renal vein noted. No adenopathy.

Reproductive: Uterus is enlarged with scattered fibroids present,
largest of which measures approximately 5.5 cm. Ovaries within
normal limits.

Other: Postoperative changes from recent umbilical hernia repair
seen within the ventral abdomen. Small postoperative collection seen
along the undersurface of the hernia mesh measures 6.5 x 1.2 cm
(series 3, image 59). Postoperative stranding seen within the
adjacent omental fat, with evidence of adjacent fat necrosis/omental
infarct (series 3, image 64). Additional 3.6 x 3.5 x 4.3 cm
loculated collection along the umbilicus (series 3, image 55). No
free intraperitoneal air. No other free fluid within the abdomen and
pelvis.

Musculoskeletal: No acute osseous abnormality. No worrisome lytic or
blastic osseous lesions.
IMPRESSION: 1. Postoperative changes from recent ventral hernia repair with
small collections along the hernia mesh as well as the overlying
umbilicus as above, felt to be most consistent with a postoperative
seromas. Evidence for fat necrosis within the adjacent omental fat.
2. No other acute intra-abdominal or pelvic process.
3. Moderate stool impacted within the rectal vault, suggesting
constipation.
4. Fibroid uterus.

## 2019-05-13 DIAGNOSIS — S6992XA Unspecified injury of left wrist, hand and finger(s), initial encounter: Secondary | ICD-10-CM | POA: Diagnosis not present

## 2019-05-13 DIAGNOSIS — Z23 Encounter for immunization: Secondary | ICD-10-CM | POA: Diagnosis not present

## 2019-05-28 ENCOUNTER — Ambulatory Visit (INDEPENDENT_AMBULATORY_CARE_PROVIDER_SITE_OTHER): Payer: BC Managed Care – PPO | Admitting: Obstetrics & Gynecology

## 2019-05-28 ENCOUNTER — Encounter: Payer: Self-pay | Admitting: Obstetrics & Gynecology

## 2019-05-28 ENCOUNTER — Other Ambulatory Visit: Payer: Self-pay

## 2019-05-28 VITALS — BP 128/70 | HR 84 | Temp 97.2°F | Ht 65.5 in | Wt 291.0 lb

## 2019-05-28 DIAGNOSIS — Z01419 Encounter for gynecological examination (general) (routine) without abnormal findings: Secondary | ICD-10-CM

## 2019-05-28 DIAGNOSIS — Z0000 Encounter for general adult medical examination without abnormal findings: Secondary | ICD-10-CM

## 2019-05-28 DIAGNOSIS — N951 Menopausal and female climacteric states: Secondary | ICD-10-CM | POA: Diagnosis not present

## 2019-05-28 MED ORDER — SOLIFENACIN SUCCINATE 5 MG PO TABS: 5.0000 mg | ORAL_TABLET | Freq: Every day | ORAL | 0 refills | Status: DC

## 2019-05-28 NOTE — Patient Instructions (Signed)
[employer]  --  [email or web address]  Caregiver Support Group Tue, May 14, 2019 1:00 PM - 2:00 PM Please join our meeting from your computer, tablet or smartphone. [email or web address] You can also dial in using your phone. [place] (Toll Free): [phone]. Access Code: [phone]  Caregiver Support Group Tue, May 28, 2019 6:00 PM - 7:00 PM Please join our meeting from your computer, tablet or smartphone. [email or web address] You can also dial in using your phone. [place] (Toll Free): [phone]. Access Code: [phone]

## 2019-05-28 NOTE — Progress Notes (Signed)
53 y.o. [id number] Married White or Caucasian female here for annual exam.  Has a third grandchild this year-- a boy.  She is working from home.    Cycles are still regular.  She will have two months that are light and then one heavy months.    Moved mother into their home.  She repaired her mother's home in [place] and this was placed on the market and sold.  She has moved to [place] to a home that has an apartment.    PCP:  Dr. Drema Dallas.    Patient's last menstrual period was 05/17/2019.          Sexually active: Yes.    The current method of family planning is vasectomy.    Exercising: No.  The patient does not participate in regular exercise at present. Smoker:  no  Health Maintenance: Pap:  02/13/18 Neg  12/05/16 Neg              11/20/15 Neg. HR HPV:neg  09/03/13 Neg History of abnormal Pap:  no MMG:  04/24/18 BIRADS 1 negative/density b Colonoscopy:  Willing to do cologuard BMD:   never TDaP:  12/15/16 Pneumonia vaccine(s):  n/a Shingrix:   Never Flu shot:  Completed Hep C testing: n/a Screening Labs: PCP   reports that she has never smoked. She has never used smokeless tobacco. She reports that she does not drink alcohol or use drugs.  Past Medical History:  Diagnosis Date  . Arthritis   . Asthma   . Bladder incontinence    cant hold urine  . Claustrophobia   . Complication of anesthesia    facial itching,"hot spot"  . History of kidney stones   . Hypertension   . Lyme disease 5/14  . Menorrhagia with irregular cycle   . Sleep apnea    cpap    Past Surgical History:  Procedure Laterality Date  . CESAREAN SECTION    . INSERTION OF MESH N/A 12/06/2017   Procedure: INSERTION OF MESH;  Surgeon: Ralene Ok, MD;  Location: [place];  Service: General;  Laterality: N/A;  . REPEAT CESAREAN SECTION    . UMBILICAL HERNIA REPAIR N/A 12/06/2017   Procedure: LAPAROSCOPIC UMBILICAL HERNIA;  Surgeon: Ralene Ok, MD;  Location: [place];  Service: General;  Laterality: N/A;     Current Outpatient Medications  Medication Sig Dispense Refill  . benzonatate (TESSALON) 100 MG capsule Take 100 mg by mouth 3 (three) times daily as needed for cough.   1  . diclofenac sodium (VOLTAREN) 1 % GEL Apply 2 g topically 4 (four) times daily as needed (pain).    . hydrochlorothiazide (HYDRODIURIL) 25 MG tablet Take 1 tablet (25 mg total) by mouth daily. 90 tablet 4  . ipratropium-albuterol (DUONEB) 0.5-2.5 (3) MG/3ML SOLN Take 3 mLs by nebulization every 6 (six) hours as needed (wheezing or shortness of breath).    . loratadine (CLARITIN) 10 MG tablet Take 10 mg by mouth daily as needed for allergies.    Marland Kitchen losartan (COZAAR) 100 MG tablet Take 1 tablet (100 mg total) by mouth daily. 90 tablet 4  . meloxicam (MOBIC) 15 MG tablet Take 1 tablet by mouth daily.   2  . methocarbamol (ROBAXIN) 500 MG tablet Take 500 mg by mouth as needed for muscle spasms.    . naproxen (NAPROSYN) 500 MG tablet Take 500 mg by mouth daily.    Marland Kitchen PROAIR HFA 108 (90 Base) MCG/ACT inhaler Inhale 1-2 puffs into the lungs every  6 (six) hours as needed for wheezing or shortness of breath.   0  . traMADol (ULTRAM) 50 MG tablet TAKE 1 TO 2 TABLETS BY MOUTH EVERY 6 TO 8 HOURS AS NEEDED FOR PAIN  0  . triamcinolone ointment (KENALOG) 0.5 % Apply 1 application topically 2 (two) times daily. 30 g 0  . Vitamin D, Ergocalciferol, (DRISDOL) 50000 units CAPS capsule TAKE ONE CAPSULE BY MOUTH ONCE WEEKLY 12 capsule 4  . Trospium Chloride 60 MG CP24 Take 1 capsule (60 mg total) by mouth daily. (Patient not taking: Reported on 05/28/2019) 90 capsule 4   No current facility-administered medications for this visit.     Family History  Problem Relation Age of Onset  . Liver disease Mother        nonalcoholic fatty liver disease  . Hepatitis C Mother   . Cancer Father        lung cancer  . Breast cancer Maternal Grandmother     Review of Systems  Constitutional: Negative.   HENT: Negative.   Eyes: Negative.    Respiratory: Negative.   Cardiovascular: Negative.   Gastrointestinal: Negative.   Endocrine: Negative.   Genitourinary: Negative.   Musculoskeletal: Negative.   Skin: Negative.   Allergic/Immunologic: Negative.   Neurological: Negative.   Hematological: Negative.   Psychiatric/Behavioral: Negative.     Exam:   BP 128/70 (BP Location: Right Arm, Patient Position: Sitting, Cuff Size: Large)   Pulse 84   Temp (!) 97.2 F (36.2 C) (Temporal)   Ht 5' 5.5" (1.664 m)   Wt 291 lb (132 kg)   LMP 05/17/2019   BMI 47.69 kg/m    Height: 5' 5.5" (166.4 cm)  Ht Readings from Last 3 Encounters:  05/28/19 5' 5.5" (1.664 m)  02/08/18 5' 5.5" (1.664 m)  12/15/17 5\' 7"  (1.702 m)    General appearance: alert, cooperative and appears stated age Head: Normocephalic, without obvious abnormality, atraumatic Neck: no adenopathy, supple, symmetrical, trachea midline and thyroid normal to inspection and palpation Lungs: clear to auscultation bilaterally Breasts: normal appearance, no masses or tenderness Heart: regular rate and rhythm Abdomen: soft, non-tender; bowel sounds normal; no masses,  no organomegaly Extremities: extremities normal, atraumatic, no cyanosis or edema Skin: Skin color, texture, turgor normal. No rashes or lesions Lymph nodes: Cervical, supraclavicular, and axillary nodes normal. No abnormal inguinal nodes palpated Neurologic: Grossly normal   Pelvic: External genitalia:  no lesions              Urethra:  normal appearing urethra with no masses, tenderness or lesions              Bartholins and Skenes: normal                 Vagina: normal appearing vagina with normal color and discharge, no lesions              Cervix: no lesions              Pap taken: No. Bimanual Exam:  Uterus:  normal size, contour, position, consistency, mobility, non-tender              Adnexa: normal adnexa and no mass, fullness, tenderness               Rectovaginal: Confirms                Anus:  normal sphincter tone, no lesions  Chaperone was present for exam.  A:  Well Woman with normal  exam H/O enlarged uterus Hypertension Morbid obesity OAB  P:   Mammogram guidelines reviewed.  She is aware this is due. pap smear with neg HR HPV 2017.  Pap negative last year. Trial of Vesicare 5mg  daily.  #30/0RF RF for HCTZ 25mg  daily.  #90/4RF Cologuard order will be placed Return annually or prn

## 2019-05-29 LAB — TSH: TSH: 2.12 u[IU]/mL (ref 0.450–4.500)

## 2019-05-29 LAB — LIPID PANEL
Chol/HDL Ratio: 3.1 ratio (ref 0.0–4.4)
Cholesterol, Total: 218 mg/dL — ABNORMAL HIGH (ref 100–199)
HDL: 70 mg/dL (ref >39)
LDL Chol Calc (NIH): 128 mg/dL — ABNORMAL HIGH (ref 0–99)
Triglycerides: 115 mg/dL (ref 0–149)
VLDL Cholesterol Cal: 20 mg/dL (ref 5–40)

## 2019-05-29 LAB — CBC
Hematocrit: 42.3 % (ref 34.0–46.6)
Hemoglobin: 13.6 g/dL (ref 11.1–15.9)
MCH: 27.3 pg (ref 26.6–33.0)
MCHC: 32.2 g/dL (ref 31.5–35.7)
MCV: 85 fL (ref 79–97)
Platelets: 354 10*3/uL (ref 150–450)
RBC: 4.98 x10E6/uL (ref 3.77–5.28)
RDW: 13.5 % (ref 11.7–15.4)
WBC: 8.3 10*3/uL (ref 3.4–10.8)

## 2019-05-29 LAB — FOLLICLE STIMULATING HORMONE: FSH: 6.4 m[IU]/mL

## 2019-05-29 LAB — COMPREHENSIVE METABOLIC PANEL
ALT: 8 IU/L (ref 0–32)
AST: 14 IU/L (ref 0–40)
Albumin/Globulin Ratio: 1.2 (ref 1.2–2.2)
Albumin: 4.1 g/dL (ref 3.8–4.9)
Alkaline Phosphatase: 82 IU/L (ref 39–117)
BUN/Creatinine Ratio: 18 (ref 9–23)
BUN: 12 mg/dL (ref 6–24)
Bilirubin Total: 0.6 mg/dL (ref 0.0–1.2)
CO2: 26 mmol/L (ref 20–29)
Calcium: 9.4 mg/dL (ref 8.7–10.2)
Chloride: 98 mmol/L (ref 96–106)
Creatinine, Ser: 0.68 mg/dL (ref 0.57–1.00)
GFR calc Af Amer: 115 mL/min/{1.73_m2} (ref >59)
GFR calc non Af Amer: 99 mL/min/{1.73_m2} (ref >59)
Globulin, Total: 3.4 g/dL (ref 1.5–4.5)
Glucose: 84 mg/dL (ref 65–99)
Potassium: 4.2 mmol/L (ref 3.5–5.2)
Sodium: 138 mmol/L (ref 134–144)
Total Protein: 7.5 g/dL (ref 6.0–8.5)

## 2019-05-29 LAB — VITAMIN D 25 HYDROXY (VIT D DEFICIENCY, FRACTURES): Vit D, 25-Hydroxy: 15.4 ng/mL — ABNORMAL LOW (ref 30.0–100.0)

## 2019-05-30 ENCOUNTER — Other Ambulatory Visit: Payer: Self-pay

## 2019-05-30 MED ORDER — VITAMIN D (ERGOCALCIFEROL) 1.25 MG (50000 UNIT) PO CAPS: ORAL_CAPSULE | ORAL | 0 refills | Status: DC

## 2019-06-24 ENCOUNTER — Other Ambulatory Visit: Payer: Self-pay | Admitting: Obstetrics & Gynecology

## 2019-06-28 ENCOUNTER — Other Ambulatory Visit: Payer: Self-pay | Admitting: Obstetrics & Gynecology

## 2019-07-01 NOTE — Telephone Encounter (Signed)
Medication refill request: Vesicare Last AEX:  05/28/19 Next AEX: 10/01/19 Last MMG (if hormonal medication request): 04/24/18 Bi-rads 1 neg  Refill authorized: #30 with 0 RF

## 2019-07-09 ENCOUNTER — Encounter: Payer: Self-pay | Admitting: Obstetrics & Gynecology

## 2019-07-09 DIAGNOSIS — Z1211 Encounter for screening for malignant neoplasm of colon: Secondary | ICD-10-CM | POA: Diagnosis not present

## 2019-07-09 DIAGNOSIS — Z1212 Encounter for screening for malignant neoplasm of rectum: Secondary | ICD-10-CM | POA: Diagnosis not present

## 2019-07-09 LAB — COLOGUARD: Cologuard: NEGATIVE

## 2019-07-10 DIAGNOSIS — M62838 Other muscle spasm: Secondary | ICD-10-CM | POA: Diagnosis not present

## 2019-07-16 DIAGNOSIS — M62838 Other muscle spasm: Secondary | ICD-10-CM | POA: Diagnosis not present

## 2019-07-18 DIAGNOSIS — M25532 Pain in left wrist: Secondary | ICD-10-CM | POA: Diagnosis not present

## 2019-07-18 DIAGNOSIS — M542 Cervicalgia: Secondary | ICD-10-CM | POA: Diagnosis not present

## 2019-07-18 DIAGNOSIS — M25511 Pain in right shoulder: Secondary | ICD-10-CM | POA: Diagnosis not present

## 2019-07-19 DIAGNOSIS — M47812 Spondylosis without myelopathy or radiculopathy, cervical region: Secondary | ICD-10-CM | POA: Diagnosis not present

## 2019-07-23 DIAGNOSIS — M25511 Pain in right shoulder: Secondary | ICD-10-CM | POA: Diagnosis not present

## 2019-07-24 ENCOUNTER — Telehealth: Payer: Self-pay

## 2019-07-24 NOTE — Telephone Encounter (Signed)
Tried calling patient with negative Cologuard results. No answer, left detailed message with results. Per DPR, okay to leave a detailed message at [phone]. Advised patient, if she has any questions, give our office a call back at [phone].

## 2019-08-17 ENCOUNTER — Other Ambulatory Visit: Payer: Self-pay | Admitting: Obstetrics & Gynecology

## 2019-08-27 DIAGNOSIS — M5022 Other cervical disc displacement, mid-cervical region, unspecified level: Secondary | ICD-10-CM | POA: Diagnosis not present

## 2019-08-27 DIAGNOSIS — M5412 Radiculopathy, cervical region: Secondary | ICD-10-CM | POA: Diagnosis not present

## 2019-08-27 DIAGNOSIS — M542 Cervicalgia: Secondary | ICD-10-CM | POA: Diagnosis not present

## 2019-08-30 ENCOUNTER — Other Ambulatory Visit: Payer: Self-pay

## 2019-09-04 DIAGNOSIS — M542 Cervicalgia: Secondary | ICD-10-CM | POA: Diagnosis not present

## 2019-09-04 DIAGNOSIS — M5412 Radiculopathy, cervical region: Secondary | ICD-10-CM | POA: Diagnosis not present

## 2019-09-04 DIAGNOSIS — M5022 Other cervical disc displacement, mid-cervical region, unspecified level: Secondary | ICD-10-CM | POA: Diagnosis not present

## 2019-09-06 ENCOUNTER — Other Ambulatory Visit: Payer: Self-pay

## 2019-09-19 DIAGNOSIS — M5412 Radiculopathy, cervical region: Secondary | ICD-10-CM | POA: Diagnosis not present

## 2019-09-19 DIAGNOSIS — M5022 Other cervical disc displacement, mid-cervical region, unspecified level: Secondary | ICD-10-CM | POA: Diagnosis not present

## 2019-09-19 DIAGNOSIS — M542 Cervicalgia: Secondary | ICD-10-CM | POA: Diagnosis not present

## 2019-09-23 DIAGNOSIS — M5412 Radiculopathy, cervical region: Secondary | ICD-10-CM | POA: Diagnosis not present

## 2019-09-23 DIAGNOSIS — M542 Cervicalgia: Secondary | ICD-10-CM | POA: Diagnosis not present

## 2019-09-23 DIAGNOSIS — M50222 Other cervical disc displacement at C5-C6 level: Secondary | ICD-10-CM | POA: Diagnosis not present

## 2019-09-25 DIAGNOSIS — M542 Cervicalgia: Secondary | ICD-10-CM | POA: Diagnosis not present

## 2019-09-25 DIAGNOSIS — M50222 Other cervical disc displacement at C5-C6 level: Secondary | ICD-10-CM | POA: Diagnosis not present

## 2019-09-25 DIAGNOSIS — M5412 Radiculopathy, cervical region: Secondary | ICD-10-CM | POA: Diagnosis not present

## 2019-09-27 ENCOUNTER — Other Ambulatory Visit: Payer: Self-pay | Admitting: *Deleted

## 2019-09-27 ENCOUNTER — Other Ambulatory Visit: Payer: Self-pay

## 2019-09-27 ENCOUNTER — Other Ambulatory Visit: Payer: Self-pay | Admitting: Obstetrics & Gynecology

## 2019-09-27 ENCOUNTER — Other Ambulatory Visit (INDEPENDENT_AMBULATORY_CARE_PROVIDER_SITE_OTHER): Payer: BC Managed Care – PPO

## 2019-09-27 DIAGNOSIS — E559 Vitamin D deficiency, unspecified: Secondary | ICD-10-CM | POA: Diagnosis not present

## 2019-09-27 NOTE — Telephone Encounter (Signed)
Medication refill request: Vesicare Last AEX:  05/28/19 SM Next OV: 10/01/19 Last MMG (if hormonal medication request): 04/24/18 BIRADS 1 negative/density b Refill authorized: Please advise  Patient in office today for a lab appointment for Vitamin D level. Patient states that she is doing well with Vesicare and would like to continue taking it.

## 2019-09-28 LAB — VITAMIN D 25 HYDROXY (VIT D DEFICIENCY, FRACTURES): Vit D, 25-Hydroxy: 16.4 ng/mL — ABNORMAL LOW (ref 30.0–100.0)

## 2019-09-30 DIAGNOSIS — M542 Cervicalgia: Secondary | ICD-10-CM | POA: Diagnosis not present

## 2019-09-30 DIAGNOSIS — M5412 Radiculopathy, cervical region: Secondary | ICD-10-CM | POA: Diagnosis not present

## 2019-09-30 DIAGNOSIS — M50222 Other cervical disc displacement at C5-C6 level: Secondary | ICD-10-CM | POA: Diagnosis not present

## 2019-10-01 ENCOUNTER — Ambulatory Visit: Payer: BLUE CROSS/BLUE SHIELD | Admitting: Obstetrics & Gynecology

## 2019-10-02 DIAGNOSIS — M5412 Radiculopathy, cervical region: Secondary | ICD-10-CM | POA: Diagnosis not present

## 2019-10-02 DIAGNOSIS — M542 Cervicalgia: Secondary | ICD-10-CM | POA: Diagnosis not present

## 2019-10-02 DIAGNOSIS — M50222 Other cervical disc displacement at C5-C6 level: Secondary | ICD-10-CM | POA: Diagnosis not present

## 2019-10-07 DIAGNOSIS — M5412 Radiculopathy, cervical region: Secondary | ICD-10-CM | POA: Diagnosis not present

## 2019-10-07 DIAGNOSIS — M542 Cervicalgia: Secondary | ICD-10-CM | POA: Diagnosis not present

## 2019-10-07 DIAGNOSIS — M50222 Other cervical disc displacement at C5-C6 level: Secondary | ICD-10-CM | POA: Diagnosis not present

## 2019-10-09 DIAGNOSIS — M5412 Radiculopathy, cervical region: Secondary | ICD-10-CM | POA: Diagnosis not present

## 2019-10-09 DIAGNOSIS — M50222 Other cervical disc displacement at C5-C6 level: Secondary | ICD-10-CM | POA: Diagnosis not present

## 2019-10-09 DIAGNOSIS — M542 Cervicalgia: Secondary | ICD-10-CM | POA: Diagnosis not present

## 2019-10-11 MED ORDER — VITAMIN D (ERGOCALCIFEROL) 1.25 MG (50000 UNIT) PO CAPS: ORAL_CAPSULE | ORAL | 0 refills | Status: DC

## 2019-10-14 DIAGNOSIS — M542 Cervicalgia: Secondary | ICD-10-CM | POA: Diagnosis not present

## 2019-10-14 DIAGNOSIS — M5412 Radiculopathy, cervical region: Secondary | ICD-10-CM | POA: Diagnosis not present

## 2019-10-14 DIAGNOSIS — M50222 Other cervical disc displacement at C5-C6 level: Secondary | ICD-10-CM | POA: Diagnosis not present

## 2019-10-16 DIAGNOSIS — M50222 Other cervical disc displacement at C5-C6 level: Secondary | ICD-10-CM | POA: Diagnosis not present

## 2019-10-16 DIAGNOSIS — M542 Cervicalgia: Secondary | ICD-10-CM | POA: Diagnosis not present

## 2019-10-16 DIAGNOSIS — M5412 Radiculopathy, cervical region: Secondary | ICD-10-CM | POA: Diagnosis not present

## 2019-10-21 DIAGNOSIS — M50222 Other cervical disc displacement at C5-C6 level: Secondary | ICD-10-CM | POA: Diagnosis not present

## 2019-10-21 DIAGNOSIS — M5412 Radiculopathy, cervical region: Secondary | ICD-10-CM | POA: Diagnosis not present

## 2019-10-21 DIAGNOSIS — M542 Cervicalgia: Secondary | ICD-10-CM | POA: Diagnosis not present

## 2019-10-23 DIAGNOSIS — M50222 Other cervical disc displacement at C5-C6 level: Secondary | ICD-10-CM | POA: Diagnosis not present

## 2019-10-23 DIAGNOSIS — M5412 Radiculopathy, cervical region: Secondary | ICD-10-CM | POA: Diagnosis not present

## 2019-10-23 DIAGNOSIS — M542 Cervicalgia: Secondary | ICD-10-CM | POA: Diagnosis not present

## 2019-10-28 DIAGNOSIS — M542 Cervicalgia: Secondary | ICD-10-CM | POA: Diagnosis not present

## 2019-10-28 DIAGNOSIS — M5412 Radiculopathy, cervical region: Secondary | ICD-10-CM | POA: Diagnosis not present

## 2019-10-28 DIAGNOSIS — M50222 Other cervical disc displacement at C5-C6 level: Secondary | ICD-10-CM | POA: Diagnosis not present

## 2019-10-30 DIAGNOSIS — M5412 Radiculopathy, cervical region: Secondary | ICD-10-CM | POA: Diagnosis not present

## 2019-10-30 DIAGNOSIS — M50222 Other cervical disc displacement at C5-C6 level: Secondary | ICD-10-CM | POA: Diagnosis not present

## 2019-10-30 DIAGNOSIS — M542 Cervicalgia: Secondary | ICD-10-CM | POA: Diagnosis not present

## 2019-11-05 DIAGNOSIS — M5412 Radiculopathy, cervical region: Secondary | ICD-10-CM | POA: Diagnosis not present

## 2019-11-05 DIAGNOSIS — M542 Cervicalgia: Secondary | ICD-10-CM | POA: Diagnosis not present

## 2019-11-05 DIAGNOSIS — M50222 Other cervical disc displacement at C5-C6 level: Secondary | ICD-10-CM | POA: Diagnosis not present

## 2019-12-11 ENCOUNTER — Other Ambulatory Visit: Payer: Self-pay | Admitting: Family Medicine

## 2019-12-11 DIAGNOSIS — Z1231 Encounter for screening mammogram for malignant neoplasm of breast: Secondary | ICD-10-CM

## 2019-12-24 ENCOUNTER — Ambulatory Visit: Payer: BC Managed Care – PPO | Admitting: Obstetrics & Gynecology

## 2019-12-31 ENCOUNTER — Other Ambulatory Visit: Payer: Self-pay | Admitting: Obstetrics & Gynecology

## 2020-01-08 ENCOUNTER — Other Ambulatory Visit: Payer: Self-pay | Admitting: Obstetrics & Gynecology

## 2020-01-08 DIAGNOSIS — Z6841 Body Mass Index (BMI) 40.0 and over, adult: Secondary | ICD-10-CM | POA: Diagnosis not present

## 2020-01-08 DIAGNOSIS — I10 Essential (primary) hypertension: Secondary | ICD-10-CM | POA: Diagnosis not present

## 2020-01-08 DIAGNOSIS — J45909 Unspecified asthma, uncomplicated: Secondary | ICD-10-CM | POA: Diagnosis not present

## 2020-01-08 DIAGNOSIS — E559 Vitamin D deficiency, unspecified: Secondary | ICD-10-CM | POA: Diagnosis not present

## 2020-01-08 NOTE — Telephone Encounter (Signed)
Medication refill request: Vesicare Last AEX:  05-28-2019 SM Next AEX: 05-27-21  Last MMG (if hormonal medication request): n/a Refill authorized: Today, please advise.   Medication pended for #90, 1RF. Please refill if appropriate.

## 2020-01-20 ENCOUNTER — Other Ambulatory Visit: Payer: Self-pay

## 2020-01-27 ENCOUNTER — Other Ambulatory Visit (INDEPENDENT_AMBULATORY_CARE_PROVIDER_SITE_OTHER): Payer: BC Managed Care – PPO

## 2020-01-27 ENCOUNTER — Other Ambulatory Visit: Payer: Self-pay

## 2020-01-27 ENCOUNTER — Other Ambulatory Visit: Payer: Self-pay | Admitting: *Deleted

## 2020-01-27 DIAGNOSIS — E559 Vitamin D deficiency, unspecified: Secondary | ICD-10-CM

## 2020-01-28 LAB — VITAMIN D 25 HYDROXY (VIT D DEFICIENCY, FRACTURES): Vit D, 25-Hydroxy: 18.9 ng/mL — ABNORMAL LOW (ref 30.0–100.0)

## 2020-02-12 ENCOUNTER — Other Ambulatory Visit: Payer: Self-pay | Admitting: Obstetrics & Gynecology

## 2020-02-13 NOTE — Telephone Encounter (Signed)
Medication refill request: Vitamin D  Last AEX:  05-28-2019 SM  Next AEX: 05-22-20 Last MMG (if hormonal medication request): n/a Refill authorized: Today, please advise.   Medication pended for #24, 0RF. Please refill if appropriate.

## 2020-02-20 DIAGNOSIS — M542 Cervicalgia: Secondary | ICD-10-CM | POA: Diagnosis not present

## 2020-02-20 DIAGNOSIS — M5412 Radiculopathy, cervical region: Secondary | ICD-10-CM | POA: Diagnosis not present

## 2020-02-20 DIAGNOSIS — M50222 Other cervical disc displacement at C5-C6 level: Secondary | ICD-10-CM | POA: Diagnosis not present

## 2020-02-21 ENCOUNTER — Other Ambulatory Visit: Payer: Self-pay | Admitting: Obstetrics & Gynecology

## 2020-02-24 ENCOUNTER — Other Ambulatory Visit: Payer: Self-pay

## 2020-02-24 NOTE — Telephone Encounter (Signed)
Patient is calling in regards to check status of vit D refill. Patient states her pharmacy has not received the prescription.

## 2020-02-25 DIAGNOSIS — Z13228 Encounter for screening for other metabolic disorders: Secondary | ICD-10-CM | POA: Diagnosis not present

## 2020-02-25 DIAGNOSIS — I10 Essential (primary) hypertension: Secondary | ICD-10-CM | POA: Diagnosis not present

## 2020-02-25 DIAGNOSIS — M1990 Unspecified osteoarthritis, unspecified site: Secondary | ICD-10-CM | POA: Insufficient documentation

## 2020-02-25 DIAGNOSIS — E559 Vitamin D deficiency, unspecified: Secondary | ICD-10-CM | POA: Insufficient documentation

## 2020-02-25 DIAGNOSIS — N3946 Mixed incontinence: Secondary | ICD-10-CM | POA: Insufficient documentation

## 2020-02-25 DIAGNOSIS — N921 Excessive and frequent menstruation with irregular cycle: Secondary | ICD-10-CM | POA: Insufficient documentation

## 2020-02-25 DIAGNOSIS — M4802 Spinal stenosis, cervical region: Secondary | ICD-10-CM | POA: Insufficient documentation

## 2020-02-25 DIAGNOSIS — D252 Subserosal leiomyoma of uterus: Secondary | ICD-10-CM | POA: Diagnosis not present

## 2020-02-25 DIAGNOSIS — Z6841 Body Mass Index (BMI) 40.0 and over, adult: Secondary | ICD-10-CM | POA: Diagnosis not present

## 2020-02-25 DIAGNOSIS — F4321 Adjustment disorder with depressed mood: Secondary | ICD-10-CM | POA: Diagnosis not present

## 2020-02-25 NOTE — Telephone Encounter (Signed)
Tried to call patient to verify pharmacy information as it appears a script was recently sent in. Called twice and number states unable to complete call at this time.   York Cerise, CMA

## 2020-02-27 DIAGNOSIS — Z6841 Body Mass Index (BMI) 40.0 and over, adult: Secondary | ICD-10-CM | POA: Diagnosis not present

## 2020-02-27 NOTE — Telephone Encounter (Signed)
Left message for pt to call and verify pharmacy if she has still not been able to pick up her prescription .   York Cerise, CMA

## 2020-03-03 DIAGNOSIS — Z713 Dietary counseling and surveillance: Secondary | ICD-10-CM | POA: Diagnosis not present

## 2020-03-03 DIAGNOSIS — Z6841 Body Mass Index (BMI) 40.0 and over, adult: Secondary | ICD-10-CM | POA: Diagnosis not present

## 2020-03-03 DIAGNOSIS — Z712 Person consulting for explanation of examination or test findings: Secondary | ICD-10-CM | POA: Diagnosis not present

## 2020-03-18 DIAGNOSIS — Z6841 Body Mass Index (BMI) 40.0 and over, adult: Secondary | ICD-10-CM | POA: Diagnosis not present

## 2020-03-18 DIAGNOSIS — I10 Essential (primary) hypertension: Secondary | ICD-10-CM | POA: Diagnosis not present

## 2020-03-18 DIAGNOSIS — Z7689 Persons encountering health services in other specified circumstances: Secondary | ICD-10-CM | POA: Diagnosis not present

## 2020-03-24 DIAGNOSIS — M5412 Radiculopathy, cervical region: Secondary | ICD-10-CM | POA: Diagnosis not present

## 2020-03-24 DIAGNOSIS — M542 Cervicalgia: Secondary | ICD-10-CM | POA: Diagnosis not present

## 2020-03-24 DIAGNOSIS — M50222 Other cervical disc displacement at C5-C6 level: Secondary | ICD-10-CM | POA: Diagnosis not present

## 2020-03-24 DIAGNOSIS — M25521 Pain in right elbow: Secondary | ICD-10-CM | POA: Diagnosis not present

## 2020-03-26 DIAGNOSIS — M47892 Other spondylosis, cervical region: Secondary | ICD-10-CM | POA: Diagnosis not present

## 2020-03-26 DIAGNOSIS — M5412 Radiculopathy, cervical region: Secondary | ICD-10-CM | POA: Diagnosis not present

## 2020-03-31 ENCOUNTER — Other Ambulatory Visit: Payer: Self-pay | Admitting: Obstetrics & Gynecology

## 2020-03-31 DIAGNOSIS — M5412 Radiculopathy, cervical region: Secondary | ICD-10-CM | POA: Diagnosis not present

## 2020-03-31 DIAGNOSIS — M47892 Other spondylosis, cervical region: Secondary | ICD-10-CM | POA: Diagnosis not present

## 2020-03-31 DIAGNOSIS — R350 Frequency of micturition: Secondary | ICD-10-CM

## 2020-03-31 NOTE — Telephone Encounter (Signed)
Medication refill request: Solifenacin 5mg   Last AEX:  05/28/19 Next AEX: 05/22/20 Last MMG (if hormonal medication request): 04/24/18 Neg Refill authorized: 90/0

## 2020-04-09 DIAGNOSIS — Z6841 Body Mass Index (BMI) 40.0 and over, adult: Secondary | ICD-10-CM | POA: Diagnosis not present

## 2020-04-09 DIAGNOSIS — Z7189 Other specified counseling: Secondary | ICD-10-CM | POA: Diagnosis not present

## 2020-04-09 DIAGNOSIS — Z713 Dietary counseling and surveillance: Secondary | ICD-10-CM | POA: Diagnosis not present

## 2020-04-10 DIAGNOSIS — M5412 Radiculopathy, cervical region: Secondary | ICD-10-CM | POA: Diagnosis not present

## 2020-04-10 DIAGNOSIS — M47892 Other spondylosis, cervical region: Secondary | ICD-10-CM | POA: Diagnosis not present

## 2020-04-24 DIAGNOSIS — M5412 Radiculopathy, cervical region: Secondary | ICD-10-CM | POA: Diagnosis not present

## 2020-04-24 DIAGNOSIS — M47892 Other spondylosis, cervical region: Secondary | ICD-10-CM | POA: Diagnosis not present

## 2020-04-27 DIAGNOSIS — Z713 Dietary counseling and surveillance: Secondary | ICD-10-CM | POA: Diagnosis not present

## 2020-04-27 DIAGNOSIS — M5412 Radiculopathy, cervical region: Secondary | ICD-10-CM | POA: Diagnosis not present

## 2020-04-27 DIAGNOSIS — Z6841 Body Mass Index (BMI) 40.0 and over, adult: Secondary | ICD-10-CM | POA: Diagnosis not present

## 2020-04-27 DIAGNOSIS — M47892 Other spondylosis, cervical region: Secondary | ICD-10-CM | POA: Diagnosis not present

## 2020-04-27 DIAGNOSIS — R110 Nausea: Secondary | ICD-10-CM | POA: Diagnosis not present

## 2020-05-07 DIAGNOSIS — M5412 Radiculopathy, cervical region: Secondary | ICD-10-CM | POA: Diagnosis not present

## 2020-05-07 DIAGNOSIS — M542 Cervicalgia: Secondary | ICD-10-CM | POA: Diagnosis not present

## 2020-05-11 DIAGNOSIS — Z713 Dietary counseling and surveillance: Secondary | ICD-10-CM | POA: Diagnosis not present

## 2020-05-11 DIAGNOSIS — Z6841 Body Mass Index (BMI) 40.0 and over, adult: Secondary | ICD-10-CM | POA: Diagnosis not present

## 2020-05-11 DIAGNOSIS — I10 Essential (primary) hypertension: Secondary | ICD-10-CM | POA: Diagnosis not present

## 2020-05-11 DIAGNOSIS — E662 Morbid (severe) obesity with alveolar hypoventilation: Secondary | ICD-10-CM | POA: Diagnosis not present

## 2020-05-11 DIAGNOSIS — Z760 Encounter for issue of repeat prescription: Secondary | ICD-10-CM | POA: Diagnosis not present

## 2020-05-13 DIAGNOSIS — M47892 Other spondylosis, cervical region: Secondary | ICD-10-CM | POA: Diagnosis not present

## 2020-05-13 DIAGNOSIS — M5412 Radiculopathy, cervical region: Secondary | ICD-10-CM | POA: Diagnosis not present

## 2020-05-19 NOTE — Progress Notes (Signed)
54 y.o. [id number] Married White or Caucasian female here for annual exam. Doing well.  Now in [facility].  Mother is in mother in law suite/apartment in their home.  Now has three grandchildren, two granddaughters and one grandson.  She is thrilled with having a grandson.    Still working from.  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    Cycles have gotten very light during this past year.  However, the current cycle was very heavy with large clots.  It has improved but pt reports this was like cycles when she was 12 and 13.  She's so tired of this.  Denies SOB or palpitations.  Patient's last menstrual period was 05/12/2020 (exact date).          Sexually active: Yes.    The current method of family planning is vasectomy.    Exercising: Yes.    treadmill Smoker:  no  Health Maintenance: Pap:  11-20-15 neg HPV HR neg, 12-05-16 neg, 02-13-18 neg History of abnormal Pap:  no MMG:  04-24-18 category b density birads 1:neg Colonoscopy: cologuard neg 2020.  Due again 3 years.   BMD:   none TDaP:  2018 Pneumonia vaccine(s):  none Shingrix:   Not done Hep C testing: not needed due to birth year Screening Labs: last blood work was 01/2020 at [facility].  Reviewed in care everywhere   reports that she has never smoked. She has never used smokeless tobacco. She reports that she does not drink alcohol and does not use drugs.  Past Medical History:  Diagnosis Date  . Arthritis   . Asthma   . Bladder incontinence    cant hold urine  . Claustrophobia   . Complication of anesthesia    facial itching,"hot spot"  . History of kidney stones   . Hypertension   . Lyme disease 5/14  . Menorrhagia with irregular cycle   . Sleep apnea    cpap  . Spinal stenosis     Past Surgical History:  Procedure Laterality Date  . CESAREAN SECTION    . INSERTION OF MESH N/A 12/06/2017   Procedure: INSERTION OF MESH;  Surgeon: Ralene Ok, MD;  Location: [place];  Service: General;  Laterality: N/A;   . REPEAT CESAREAN SECTION    . UMBILICAL HERNIA REPAIR N/A 12/06/2017   Procedure: LAPAROSCOPIC UMBILICAL HERNIA;  Surgeon: Ralene Ok, MD;  Location: [place];  Service: General;  Laterality: N/A;    Current Outpatient Medications  Medication Sig Dispense Refill  . benzonatate (TESSALON) 100 MG capsule Take 100 mg by mouth 3 (three) times daily as needed for cough.   1  . diclofenac sodium (VOLTAREN) 1 % GEL Apply 2 g topically 4 (four) times daily as needed (pain).    Marland Kitchen loratadine (CLARITIN) 10 MG tablet Take 10 mg by mouth daily as needed for allergies.    Marland Kitchen losartan-hydrochlorothiazide (HYZAAR) 100-25 MG tablet Take 1 tablet by mouth daily.    . methocarbamol (ROBAXIN) 500 MG tablet Take 500 mg by mouth as needed for muscle spasms.    . naproxen (NAPROSYN) 500 MG tablet Take 500 mg by mouth daily.    . ondansetron (ZOFRAN) 4 MG tablet Take by mouth.    Marland Kitchen PROAIR HFA 108 (90 Base) MCG/ACT inhaler Inhale 1-2 puffs into the lungs every 6 (six) hours as needed for wheezing or shortness of breath.   0  . Semaglutide-Weight Management (WEGOVY) 0.5 MG/0.5ML SOAJ Inject into the skin.    Marland Kitchen  solifenacin (VESICARE) 5 MG tablet TAKE 1 TABLET (5 MG TOTAL) BY MOUTH DAILY. ONE BY MOUTH EVERY DAY 90 tablet 0  . traMADol (ULTRAM) 50 MG tablet TAKE 1 TO 2 TABLETS BY MOUTH EVERY 6 TO 8 HOURS AS NEEDED FOR PAIN  0   No current facility-administered medications for this visit.    Family History  Problem Relation Age of Onset  . Liver disease Mother        nonalcoholic fatty liver disease  . Hepatitis C Mother   . Cancer Father        lung cancer  . Breast cancer Maternal Grandmother     Review of Systems  Constitutional: Negative.   HENT: Negative.   Eyes: Negative.   Respiratory: Negative.   Cardiovascular: Negative.   Gastrointestinal: Negative.   Endocrine: Negative.   Genitourinary:       Painful heavy menstrual cycle  Musculoskeletal: Negative.   Skin: Negative.    Allergic/Immunologic: Negative.   Neurological: Negative.   Hematological: Negative.   Psychiatric/Behavioral: Negative.     Exam:   BP 116/80   Pulse 70   Resp 16   Ht 5' 5.75" (1.67 m)   Wt (!) 303 lb (137.4 kg)   LMP 05/12/2020 (Exact Date)   BMI 49.28 kg/m   Height: 5' 5.75" (167 cm)  General appearance: alert, cooperative and appears stated age Head: Normocephalic, without obvious abnormality, atraumatic Neck: no adenopathy, supple, symmetrical, trachea midline and thyroid normal to inspection and palpation Lungs: clear to auscultation bilaterally Breasts: normal appearance, no masses or tenderness Heart: regular rate and rhythm Abdomen: soft, non-tender; bowel sounds normal; no masses,  no organomegaly Extremities: extremities normal, atraumatic, no cyanosis or edema Skin: Skin color, texture, turgor normal. No rashes or lesions Lymph nodes: Cervical, supraclavicular, and axillary nodes normal. No abnormal inguinal nodes palpated Neurologic: Grossly normal   Pelvic: External genitalia:  no lesions              Urethra:  normal appearing urethra with no masses, tenderness or lesions              Bartholins and Skenes: normal                 Vagina: normal appearing vagina with normal color and discharge, no lesions              Cervix: no lesions              Pap taken: Yes.   Bimanual Exam:  Uterus:  enlarged, 8-10 weeks, anteflexed and fixed weeks size              Adnexa: no mass, fullness, tenderness               Rectovaginal: Confirms               Anus:  normal sphincter tone, no lesions  Endometrial biopsy recommended due to amount of bleeding.  Discussed with patient.  Verbal and written consent obtained.   Procedure:  Speculum placed.  Cervix visualized and cleansed with betadine prep.  A single toothed tenaculum was applied to the anterior lip of the cervix.  Attempt made to pass endometrial pipelle through cervical os.  Multiple attempts made but  unsuccessful.  Visualization of os is difficult due to speculum angle.  After several attempts and with pt discomfort, procedure ended.  Tenculum removed.  No increased bleeding noted.  Patient tolerated attempted procedure well.  Chaperone, Royal Hawthorn,  CMA, was present for exam.  A:  Well Woman with normal exam Perimenopausal bleeding changes Obesity Hypertension Vit D def Sleep apnea OAB  P:   Mammogram guidelines reviewed.  She is aware is [place] overdue Attempted biopsy performed today.  Will have pt return for PUS due to lack of success obtaining results pap smear with HR HPV obtained today [place] obtained Vit D obtained Vesicare 5mg  daily obtained.  #90/4RF return annually or prn

## 2020-05-22 ENCOUNTER — Other Ambulatory Visit: Payer: Self-pay

## 2020-05-22 ENCOUNTER — Other Ambulatory Visit (HOSPITAL_COMMUNITY)
Admission: RE | Admit: 2020-05-22 | Discharge: 2020-05-22 | Disposition: A | Discharge status: Home / Self Care | Payer: BC Managed Care – PPO | Source: Ambulatory Visit | Attending: Obstetrics & Gynecology | Admitting: Obstetrics & Gynecology

## 2020-05-22 ENCOUNTER — Ambulatory Visit (INDEPENDENT_AMBULATORY_CARE_PROVIDER_SITE_OTHER): Payer: BC Managed Care – PPO | Admitting: Obstetrics & Gynecology

## 2020-05-22 ENCOUNTER — Encounter: Payer: Self-pay | Admitting: Obstetrics & Gynecology

## 2020-05-22 VITALS — BP 116/80 | HR 70 | Resp 16 | Ht 65.75 in | Wt 303.0 lb

## 2020-05-22 DIAGNOSIS — Z124 Encounter for screening for malignant neoplasm of cervix: Secondary | ICD-10-CM | POA: Insufficient documentation

## 2020-05-22 DIAGNOSIS — N924 Excessive bleeding in the premenopausal period: Secondary | ICD-10-CM

## 2020-05-22 DIAGNOSIS — E559 Vitamin D deficiency, unspecified: Secondary | ICD-10-CM | POA: Diagnosis not present

## 2020-05-22 DIAGNOSIS — Z01419 Encounter for gynecological examination (general) (routine) without abnormal findings: Secondary | ICD-10-CM

## 2020-05-22 DIAGNOSIS — R350 Frequency of micturition: Secondary | ICD-10-CM

## 2020-05-22 MED ORDER — SOLIFENACIN SUCCINATE 5 MG PO TABS: ORAL_TABLET | ORAL | 4 refills | Status: DC

## 2020-05-23 LAB — VITAMIN D 25 HYDROXY (VIT D DEFICIENCY, FRACTURES): Vit D, 25-Hydroxy: 24.5 ng/mL — ABNORMAL LOW (ref 30.0–100.0)

## 2020-05-23 LAB — FOLLICLE STIMULATING HORMONE: FSH: 22.4 m[IU]/mL

## 2020-05-24 ENCOUNTER — Encounter: Payer: Self-pay | Admitting: Obstetrics & Gynecology

## 2020-05-25 ENCOUNTER — Telehealth: Payer: Self-pay | Admitting: *Deleted

## 2020-05-25 DIAGNOSIS — N924 Excessive bleeding in the premenopausal period: Secondary | ICD-10-CM

## 2020-05-25 LAB — CYTOLOGY - PAP
Comment: NEGATIVE
Diagnosis: NEGATIVE
High risk HPV: NEGATIVE

## 2020-05-25 NOTE — Telephone Encounter (Signed)
-----   Message from Megan Salon, MD sent at 05/24/2020  8:03 AM EDT ----- Angie Fava and Armoni Kludt,This is definitiely a person that I'd like to have an ultrasound before my departure.  Please put in the notes for the ultrasound that I would like to have ultrasound guided biopsy if endometrium is thickened.  Thanks.Katelyn Cohen

## 2020-05-25 NOTE — Telephone Encounter (Signed)
Spoke with patient. Patient will be out of town 11/1 -11/5.  PUS with possible US guided EMB scheduled for 11/11 at 12:30pm, consult to follow with Dr. Sabra Heck. Order placed for EMB for precert.  Patient is agreeable to date and time.   Routing to provider for final review. Patient is agreeable to disposition. Will close encounter.  Cc: Hayley Carder

## 2020-05-26 ENCOUNTER — Telehealth: Payer: Self-pay

## 2020-05-26 NOTE — Telephone Encounter (Signed)
Spoke with patient regarding benefits for scheduled Pelvic ultrasound and endometrial biopsy. Patient acknowledges understanding of information presented. Encounter closed.

## 2020-05-27 DIAGNOSIS — Z6841 Body Mass Index (BMI) 40.0 and over, adult: Secondary | ICD-10-CM | POA: Diagnosis not present

## 2020-05-27 DIAGNOSIS — R7303 Prediabetes: Secondary | ICD-10-CM | POA: Diagnosis not present

## 2020-05-27 DIAGNOSIS — Z713 Dietary counseling and surveillance: Secondary | ICD-10-CM | POA: Diagnosis not present

## 2020-05-27 DIAGNOSIS — Z760 Encounter for issue of repeat prescription: Secondary | ICD-10-CM | POA: Diagnosis not present

## 2020-06-01 DIAGNOSIS — C541 Malignant neoplasm of endometrium: Secondary | ICD-10-CM

## 2020-06-01 HISTORY — DX: Malignant neoplasm of endometrium: C54.1

## 2020-06-09 ENCOUNTER — Other Ambulatory Visit: Payer: Self-pay | Admitting: *Deleted

## 2020-06-09 DIAGNOSIS — Z713 Dietary counseling and surveillance: Secondary | ICD-10-CM | POA: Diagnosis not present

## 2020-06-09 DIAGNOSIS — Z6841 Body Mass Index (BMI) 40.0 and over, adult: Secondary | ICD-10-CM | POA: Diagnosis not present

## 2020-06-09 DIAGNOSIS — I10 Essential (primary) hypertension: Secondary | ICD-10-CM | POA: Diagnosis not present

## 2020-06-09 DIAGNOSIS — N924 Excessive bleeding in the premenopausal period: Secondary | ICD-10-CM

## 2020-06-09 DIAGNOSIS — R7303 Prediabetes: Secondary | ICD-10-CM | POA: Diagnosis not present

## 2020-06-11 ENCOUNTER — Ambulatory Visit (INDEPENDENT_AMBULATORY_CARE_PROVIDER_SITE_OTHER): Payer: BC Managed Care – PPO

## 2020-06-11 ENCOUNTER — Encounter: Payer: Self-pay | Admitting: Obstetrics and Gynecology

## 2020-06-11 ENCOUNTER — Other Ambulatory Visit: Payer: Self-pay | Admitting: Obstetrics and Gynecology

## 2020-06-11 ENCOUNTER — Other Ambulatory Visit: Payer: Self-pay | Admitting: Obstetrics & Gynecology

## 2020-06-11 ENCOUNTER — Other Ambulatory Visit (HOSPITAL_COMMUNITY)
Admission: RE | Admit: 2020-06-11 | Discharge: 2020-06-11 | Disposition: A | Discharge status: Home / Self Care | Payer: BC Managed Care – PPO | Source: Ambulatory Visit | Attending: Obstetrics and Gynecology | Admitting: Obstetrics and Gynecology

## 2020-06-11 ENCOUNTER — Ambulatory Visit (INDEPENDENT_AMBULATORY_CARE_PROVIDER_SITE_OTHER): Payer: BC Managed Care – PPO | Admitting: Obstetrics and Gynecology

## 2020-06-11 ENCOUNTER — Other Ambulatory Visit: Payer: Self-pay

## 2020-06-11 ENCOUNTER — Other Ambulatory Visit: Payer: Self-pay | Admitting: Physician Assistant

## 2020-06-11 DIAGNOSIS — N924 Excessive bleeding in the premenopausal period: Secondary | ICD-10-CM

## 2020-06-11 DIAGNOSIS — N882 Stricture and stenosis of cervix uteri: Secondary | ICD-10-CM

## 2020-06-11 DIAGNOSIS — C541 Malignant neoplasm of endometrium: Secondary | ICD-10-CM | POA: Diagnosis not present

## 2020-06-11 MED ORDER — MEDROXYPROGESTERONE ACETATE 5 MG PO TABS: ORAL_TABLET | ORAL | 3 refills | Status: DC

## 2020-06-11 MED ORDER — NORETHINDRONE ACETATE 5 MG PO TABS: ORAL_TABLET | ORAL | 0 refills | Status: DC

## 2020-06-11 NOTE — Patient Instructions (Signed)

## 2020-06-11 NOTE — Progress Notes (Signed)
GYNECOLOGY  VISIT   HPI: 54 y.o.   Married White or Caucasian Not Hispanic or Latino  female   [phone] with Patient's last menstrual period was 05/12/2020 (exact date).   here for  Ultrasound consult and Endometreal biopsy.   The patient was seen by Dr Sabra Heck for abnormal perimenopausal bleeding last month. She was unable to do an endometrial biopsy at the time. FSH from last month was 22. Normal pap last month.  She has been bleeding for 2 months straight. For 10 days she was passing golf sized clots and having horrible cramps.  She reports irregular cycles, can go months without a cycle   GYNECOLOGIC HISTORY: Patient's last menstrual period was 05/12/2020 (exact date). Contraception:none  Menopausal hormone therapy: none         OB History    Gravida  4   Para  2   Term  2   Preterm      AB  2   Living  2     SAB  2   TAB      Ectopic      Multiple      Live Births                 Patient Active Problem List   Diagnosis Date Noted  . Menorrhagia with irregular cycle 02/25/2020  . Arthritis 02/25/2020  . Spinal stenosis in cervical region 02/25/2020  . Urinary incontinence, mixed 02/25/2020  . Vitamin D deficiency 02/25/2020  . Fibroids, subserous 03/31/2016  . Adiposity 11/20/2015  . Essential hypertension 04/22/2010  . Allergic rhinitis 04/22/2010    Past Medical History:  Diagnosis Date  . Arthritis   . Asthma   . Bladder incontinence    cant hold urine  . Claustrophobia   . Complication of anesthesia    facial itching,"hot spot"  . History of kidney stones   . Hypertension   . Lyme disease 5/14  . Menorrhagia with irregular cycle   . Sleep apnea    cpap  . Spinal stenosis     Past Surgical History:  Procedure Laterality Date  . CESAREAN SECTION    . INSERTION OF MESH N/A 12/06/2017   Procedure: INSERTION OF MESH;  Surgeon: Ralene Ok, MD;  Location: [place];  Service: General;  Laterality: N/A;  . REPEAT CESAREAN SECTION    .  UMBILICAL HERNIA REPAIR N/A 12/06/2017   Procedure: LAPAROSCOPIC UMBILICAL HERNIA;  Surgeon: Ralene Ok, MD;  Location: [place];  Service: General;  Laterality: N/A;    Current Outpatient Medications  Medication Sig Dispense Refill  . benzonatate (TESSALON) 100 MG capsule Take 100 mg by mouth 3 (three) times daily as needed for cough.   1  . diclofenac sodium (VOLTAREN) 1 % GEL Apply 2 g topically 4 (four) times daily as needed (pain).    Marland Kitchen loratadine (CLARITIN) 10 MG tablet Take 10 mg by mouth daily as needed for allergies.    Marland Kitchen losartan-hydrochlorothiazide (HYZAAR) 100-25 MG tablet Take 1 tablet by mouth daily.    . methocarbamol (ROBAXIN) 500 MG tablet Take 500 mg by mouth as needed for muscle spasms.    . naproxen (NAPROSYN) 500 MG tablet Take 500 mg by mouth daily.    . ondansetron (ZOFRAN) 4 MG tablet Take by mouth.    Marland Kitchen PROAIR HFA 108 (90 Base) MCG/ACT inhaler Inhale 1-2 puffs into the lungs every 6 (six) hours as needed for wheezing or shortness of breath.   0  .  Semaglutide-Weight Management (WEGOVY) 0.5 MG/0.5ML SOAJ Inject into the skin.    Marland Kitchen solifenacin (VESICARE) 5 MG tablet TAKE 1 TABLET (5 MG TOTAL) BY MOUTH DAILY. ONE BY MOUTH EVERY DAY 90 tablet 4  . traMADol (ULTRAM) 50 MG tablet TAKE 1 TO 2 TABLETS BY MOUTH EVERY 6 TO 8 HOURS AS NEEDED FOR PAIN  0   No current facility-administered medications for this visit.     ALLERGIES: Ace inhibitors, Penicillins, and Hydrocodone  Family History  Problem Relation Age of Onset  . Liver disease Mother        nonalcoholic fatty liver disease  . Hepatitis C Mother   . Cancer Father        lung cancer  . Breast cancer Maternal Grandmother     Social History   Socioeconomic History  . Marital status: Married    Spouse name: Not on file  . Number of children: 2  . Years of education: Not on file  . Highest education level: Not on file  Occupational History  . Not on file  Tobacco Use  . Smoking status: Never Smoker  .  Smokeless tobacco: Never Used  Vaping Use  . Vaping Use: Never used  Substance and Sexual Activity  . Alcohol use: No  . Drug use: No  . Sexual activity: Yes    Partners: Male    Birth control/protection: Surgical    Comment: Vasectomy  Other Topics Concern  . Not on file  Social History Narrative  . Not on file   Social Determinants of Health   Financial Resource Strain:   . Difficulty of Paying Living Expenses: Not on file  Food Insecurity:   . Worried About Charity fundraiser in the Last Year: Not on file  . Ran Out of Food in the Last Year: Not on file  Transportation Needs:   . Lack of Transportation (Medical): Not on file  . Lack of Transportation (Non-Medical): Not on file  Physical Activity:   . Days of Exercise per Week: Not on file  . Minutes of Exercise per Session: Not on file  Stress:   . Feeling of Stress : Not on file  Social Connections:   . Frequency of Communication with Friends and Family: Not on file  . Frequency of Social Gatherings with Friends and Family: Not on file  . Attends Religious Services: Not on file  . Active Member of Clubs or Organizations: Not on file  . Attends Archivist Meetings: Not on file  . Marital Status: Not on file  Intimate Partner Violence:   . Fear of Current or Ex-Partner: Not on file  . Emotionally Abused: Not on file  . Physically Abused: Not on file  . Sexually Abused: Not on file    Review of Systems  All other systems reviewed and are negative.   PHYSICAL EXAMINATION:    BP 134/84   Pulse 86   Ht 5\' 6"  (1.676 m)   Wt (!) 303 lb (137.4 kg)   LMP 05/12/2020 (Exact Date)   SpO2 99%   BMI 48.91 kg/m     General appearance: alert, cooperative and appears stated age  Pelvic: External genitalia:  no lesions              Urethra:  normal appearing urethra with no masses, tenderness or lesions              Bartholins and Skenes: normal  Vagina: normal appearing vagina with normal  color and discharge, no lesions              Cervix: no lesions  The risks of endometrial biopsy were reviewed and a consent was obtained.  A speculum was placed in the vagina and the cervix was cleansed with Hibiclens. A tenaculum was placed on the cervix and the mini-dilators were used to dilate the cervix. The uterine evacuator was placed into the endometrial cavity under ultrasound guidance. The uterus sounded to 9-10 cm. The endometrial biopsy was performed, taking care to get a representative sample, sampling 360 degrees of the uterine cavity. Moderate tissue was obtained. The tenaculum and speculum were removed. There were no complications.    Chaperone was present for exam.  ASSESSMENT Abnormal perimenopausal bleeding. Premenopausal FSH Cervical stenosis    PLAN Endometrial biopsy done, needed to dilate the cervix Aygestin to stop bleeding, then take it for 10 days of no bleeding Discussed options of micronor, cyclic provera or mirena IUD to try and control her AUB. She would like to take cyclic provera Calendar all bleeding and f/u in 3 months    In addition the doing the endometrial biopsy and reviewing ultrasound images ~15 minutes was spent in total patient care reviewing management options.

## 2020-06-16 ENCOUNTER — Other Ambulatory Visit: Payer: Self-pay

## 2020-06-16 ENCOUNTER — Encounter: Payer: Self-pay | Admitting: Obstetrics and Gynecology

## 2020-06-16 ENCOUNTER — Other Ambulatory Visit: Payer: Self-pay | Admitting: *Deleted

## 2020-06-16 ENCOUNTER — Ambulatory Visit (INDEPENDENT_AMBULATORY_CARE_PROVIDER_SITE_OTHER): Payer: BC Managed Care – PPO | Admitting: Obstetrics and Gynecology

## 2020-06-16 VITALS — BP 136/74 | Ht 66.0 in | Wt 300.0 lb

## 2020-06-16 DIAGNOSIS — C541 Malignant neoplasm of endometrium: Secondary | ICD-10-CM | POA: Diagnosis not present

## 2020-06-16 NOTE — Progress Notes (Signed)
GYNECOLOGY  VISIT   HPI: 54 y.o.   Married White or Caucasian Not Hispanic or Latino  female   [phone] with No LMP recorded.   here to discuss endometrial biopsy results.   FINAL MICROSCOPIC DIAGNOSIS:   A. ENDOMETRIUM, BIOPSY:  - Endometrioid adenocarcinoma, FIGO grade 1  GYNECOLOGIC HISTORY: No LMP recorded. Contraception:none  Menopausal hormone therapy: none         OB History    Gravida  4   Para  2   Term  2   Preterm      AB  2   Living  2     SAB  2   TAB      Ectopic      Multiple      Live Births                 Patient Active Problem List   Diagnosis Date Noted  . Menorrhagia with irregular cycle 02/25/2020  . Arthritis 02/25/2020  . Spinal stenosis in cervical region 02/25/2020  . Urinary incontinence, mixed 02/25/2020  . Vitamin D deficiency 02/25/2020  . Fibroids, subserous 03/31/2016  . Adiposity 11/20/2015  . Essential hypertension 04/22/2010  . Allergic rhinitis 04/22/2010    Past Medical History:  Diagnosis Date  . Arthritis   . Asthma   . Bladder incontinence    cant hold urine  . Claustrophobia   . Complication of anesthesia    facial itching,"hot spot"  . History of kidney stones   . Hypertension   . Lyme disease 5/14  . Menorrhagia with irregular cycle   . Sleep apnea    cpap  . Spinal stenosis     Past Surgical History:  Procedure Laterality Date  . CESAREAN SECTION    . INSERTION OF MESH N/A 12/06/2017   Procedure: INSERTION OF MESH;  Surgeon: Ralene Ok, MD;  Location: [facility];  Service: General;  Laterality: N/A;  . REPEAT CESAREAN SECTION    . UMBILICAL HERNIA REPAIR N/A 12/06/2017   Procedure: LAPAROSCOPIC UMBILICAL HERNIA;  Surgeon: Ralene Ok, MD;  Location: [facility];  Service: General;  Laterality: N/A;    Current Outpatient Medications  Medication Sig Dispense Refill  . benzonatate (TESSALON) 100 MG capsule Take 100 mg by mouth 3 (three) times daily as needed for cough.   1  . diclofenac  sodium (VOLTAREN) 1 % GEL Apply 2 g topically 4 (four) times daily as needed (pain).    Marland Kitchen loratadine (CLARITIN) 10 MG tablet Take 10 mg by mouth daily as needed for allergies.    Marland Kitchen losartan-hydrochlorothiazide (HYZAAR) 100-25 MG tablet Take 1 tablet by mouth daily.    . medroxyPROGESTERone (PROVERA) 5 MG tablet Take one tablet daily for 5 days every month if no spontaneous cycle. 15 tablet 3  . methocarbamol (ROBAXIN) 500 MG tablet Take 500 mg by mouth as needed for muscle spasms.    . naproxen (NAPROSYN) 500 MG tablet Take 500 mg by mouth daily.    . norethindrone (AYGESTIN) 5 MG tablet Take one tablet po BID until bleeding stops, then take one tablet a day for 10 days. 30 tablet 0  . ondansetron (ZOFRAN) 4 MG tablet Take by mouth.    Marland Kitchen PROAIR HFA 108 (90 Base) MCG/ACT inhaler Inhale 1-2 puffs into the lungs every 6 (six) hours as needed for wheezing or shortness of breath.   0  . Semaglutide-Weight Management (WEGOVY) 0.5 MG/0.5ML SOAJ Inject into the skin.    Marland Kitchen solifenacin (  VESICARE) 5 MG tablet TAKE 1 TABLET (5 MG TOTAL) BY MOUTH DAILY. ONE BY MOUTH EVERY DAY 90 tablet 4  . traMADol (ULTRAM) 50 MG tablet TAKE 1 TO 2 TABLETS BY MOUTH EVERY 6 TO 8 HOURS AS NEEDED FOR PAIN  0   No current facility-administered medications for this visit.     ALLERGIES: Ace inhibitors, Penicillins, and Hydrocodone  Family History  Problem Relation Age of Onset  . Liver disease Mother        nonalcoholic fatty liver disease  . Hepatitis C Mother   . Cancer Father        lung cancer  . Breast cancer Maternal Grandmother     Social History   Socioeconomic History  . Marital status: Married    Spouse name: Not on file  . Number of children: 2  . Years of education: Not on file  . Highest education level: Not on file  Occupational History  . Not on file  Tobacco Use  . Smoking status: Never Smoker  . Smokeless tobacco: Never Used  Vaping Use  . Vaping Use: Never used  Substance and Sexual  Activity  . Alcohol use: No  . Drug use: No  . Sexual activity: Yes    Partners: Male    Birth control/protection: Surgical    Comment: Vasectomy  Other Topics Concern  . Not on file  Social History Narrative  . Not on file   Social Determinants of Health   Financial Resource Strain:   . Difficulty of Paying Living Expenses: Not on file  Food Insecurity:   . Worried About Charity fundraiser in the Last Year: Not on file  . Ran Out of Food in the Last Year: Not on file  Transportation Needs:   . Lack of Transportation (Medical): Not on file  . Lack of Transportation (Non-Medical): Not on file  Physical Activity:   . Days of Exercise per Week: Not on file  . Minutes of Exercise per Session: Not on file  Stress:   . Feeling of Stress : Not on file  Social Connections:   . Frequency of Communication with Friends and Family: Not on file  . Frequency of Social Gatherings with Friends and Family: Not on file  . Attends Religious Services: Not on file  . Active Member of Clubs or Organizations: Not on file  . Attends Archivist Meetings: Not on file  . Marital Status: Not on file  Intimate Partner Violence:   . Fear of Current or Ex-Partner: Not on file  . Emotionally Abused: Not on file  . Physically Abused: Not on file  . Sexually Abused: Not on file    Review of Systems  All other systems reviewed and are negative.   PHYSICAL EXAMINATION:    There were no vitals taken for this visit.    General appearance: alert, cooperative and appears stated age   ASSESSMENT Endometrial biopsy returned with endometrioid adenocarcinoma, FIGO grade 1    PLAN Discussed endometrial cancer with the patient and her husband. Briefly discussed staging and hysterectomy Appointment made at GYN Oncology for later this week Information on endometrial cancer from Up To Date was given to the patient

## 2020-06-18 ENCOUNTER — Encounter: Payer: Self-pay | Admitting: Gynecologic Oncology

## 2020-06-18 ENCOUNTER — Telehealth: Payer: Self-pay

## 2020-06-18 LAB — SURGICAL PATHOLOGY

## 2020-06-18 NOTE — Telephone Encounter (Signed)
No answer, left message to return call.No answer, left message to return call.

## 2020-06-19 ENCOUNTER — Encounter: Payer: Self-pay | Admitting: Oncology

## 2020-06-19 ENCOUNTER — Inpatient Hospital Stay: Payer: BC Managed Care – PPO | Attending: Gynecologic Oncology | Admitting: Gynecologic Oncology

## 2020-06-19 ENCOUNTER — Other Ambulatory Visit: Payer: Self-pay | Admitting: Gynecologic Oncology

## 2020-06-19 ENCOUNTER — Encounter: Payer: Self-pay | Admitting: Gynecologic Oncology

## 2020-06-19 ENCOUNTER — Other Ambulatory Visit: Payer: Self-pay

## 2020-06-19 VITALS — BP 126/88 | HR 95 | Temp 97.4°F | Resp 18 | Ht 66.0 in | Wt 300.0 lb

## 2020-06-19 DIAGNOSIS — Z90722 Acquired absence of ovaries, bilateral: Secondary | ICD-10-CM | POA: Insufficient documentation

## 2020-06-19 DIAGNOSIS — C541 Malignant neoplasm of endometrium: Secondary | ICD-10-CM | POA: Diagnosis not present

## 2020-06-19 DIAGNOSIS — Z90710 Acquired absence of both cervix and uterus: Secondary | ICD-10-CM | POA: Diagnosis not present

## 2020-06-19 DIAGNOSIS — Z6841 Body Mass Index (BMI) 40.0 and over, adult: Secondary | ICD-10-CM | POA: Insufficient documentation

## 2020-06-19 DIAGNOSIS — Z791 Long term (current) use of non-steroidal anti-inflammatories (NSAID): Secondary | ICD-10-CM | POA: Insufficient documentation

## 2020-06-19 DIAGNOSIS — G8918 Other acute postprocedural pain: Secondary | ICD-10-CM

## 2020-06-19 DIAGNOSIS — I10 Essential (primary) hypertension: Secondary | ICD-10-CM | POA: Insufficient documentation

## 2020-06-19 DIAGNOSIS — Z79899 Other long term (current) drug therapy: Secondary | ICD-10-CM | POA: Insufficient documentation

## 2020-06-19 MED ORDER — TRAMADOL HCL 50 MG PO TABS: 50.0000 mg | ORAL_TABLET | Freq: Four times a day (QID) | ORAL | 0 refills | Status: DC | PRN

## 2020-06-19 MED ORDER — SENNOSIDES-DOCUSATE SODIUM 8.6-50 MG PO TABS: 2.0000 | ORAL_TABLET | Freq: Every day | ORAL | 0 refills | Status: DC

## 2020-06-19 MED ORDER — IBUPROFEN 800 MG PO TABS: 800.0000 mg | ORAL_TABLET | Freq: Three times a day (TID) | ORAL | 0 refills | Status: DC | PRN

## 2020-06-19 NOTE — Progress Notes (Signed)
Consult Note: Gyn-Onc  Consult was requested by Dr. Talbert Nan for the evaluation of Katelyn Cohen 54 y.o. female  CC:  Chief Complaint  Patient presents with  . Endometrial cancer [facility])    Assessment/Plan:  Katelyn Cohen  is a 54 y.o.  year old P2 with grade 1 endometrial cancer (MMRd) in the setting of morbid obesity (BMI 48) and prior umbilical hernia repair with mesh.  A detailed discussion was held with the patient with regard to to her endometrial cancer diagnosis. We discussed the standard management options for uterine cancer which includes surgery followed possibly by adjuvant therapy depending on the results of surgery. The surgical management include a robotic assisted total hysterectomy and removal of the tubes and ovaries with sampling of lymph nodes. If a minimally invasive approach is not feasible, a laparotomy may be necessary (including for specimen delivery). The patient has been counseled about these surgical options and the risks of surgery in general including infection, bleeding, damage to surrounding structures (including bowel, bladder, ureters, nerves or vessels), and the postoperative risks of PE/ DVT, and lymphedema. I discussed that her morbid obesity and prior mesh placement put her at increased risk for complications from this surgery (especially risks of intolerance of positioning, nerve injury, and visceral injury ) and therefore I offered her medical management of her endometrial cancer with a progestin releasing IUD as a means to mitigate this risk.  I explained recent studies have demonstrated a 60 to 80% liklihood for elimination of endometrial cancer with a progestin releasing IUD.  After counseling and consideration of her options, she is in agreement to proceed with robotic assisted total hysterectomy >250gm with bilateral sapingo-oophorectomy and SLN biopsy, lysis of adhesions, possible mini-lap for specimen delivery.   She will may certainly require lysis  of adhesions given her prior hernia repair with mesh.  I explained the increased risk of bowel injury associated with this.  Given her large size uterus on ultrasound she may need a mini laparotomy for specimen delivery explained this to the patient.  She will be seen by anesthesia for preoperative clearance and discussion of postoperative pain management.  She was given the opportunity to ask questions, which were answered to her satisfaction, and she is agreement with the above mentioned plan of care.   We explained that robotic hysterectomy is typically an outpatient procedure with same day discharge provided that she is meeting appropriate discharge criteria from the PACU. We provided extensive counseling regarding post-operative expectations for recovery and restrictions/limitations. We provided information regarding multi-modal pain therapy and the importance of avoidance of opioids. I informed the patient that I anticipated a 2-4 week recovery time off of work.  We explained that after surgery we will review her definitive pathology and determine if adjuvant therapy is recommended.   She will require testing for hypermethylation of MLH1 to determine if she is a candidate for Lynch syndrome testing.   HPI: Katelyn Cohen is a 55 year old P 2 who was seen in consultation at the request of Dr Talbert Nan for evaluation and treatment of grade 1 endometrial cancer.   She reported lifelong symptoms of oligo menorrhagia.  This did not necessarily change however she had a particularly heavy menstrual period in October 2021 and a planned, scheduled GYN annual surveillance visit in November 2021 and mentioned this symptom at that time.  In the past her menstrual cycles had been approximately 3-4 times per year and very heavy.  This had not been treated  with birth control pills or other ovulatory regulation.  It had been assumed her menses were were caused by a diagnosis of fibroids.  She had never had  endometrial sampling previously.  She saw Dr Talbert Nan for a routine scheduled GYN visit on 05/22/20. Pap at that visit was normal with negative high-risk HPV. Work-up of symptoms included a transvaginal ultrasound scan and endometrial Pipelle. Transvaginal US on 06/11/2020 showed a uterus measuring 10.9 x 6.5 x 7.5 cm with an endometrial thickness of 2 cm.  The ovaries were not visualized due to body habitus. Endometrial sampling with office Pipelle was performed on 06/11/2020 and showed FIGO grade 1 endometrioid endometrial adenocarcinoma, MMRd (loss of expression of MLH1, and PMS2).   The patient's medical history is significant for extreme morbid obesity with a BMI of 48 kg per metered squared.  She denies a history of diabetes, but does have hypertension.  She has had 2 prior cesarean sections and an umbilical hernia repair with mesh performed laparoscopically in 2019.  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  Current Meds:  Outpatient Encounter Medications as of 06/19/2020  Medication Sig  . benzonatate (TESSALON) 100 MG capsule Take 100 mg by mouth 3 (three) times daily as needed for cough.   . diclofenac sodium (VOLTAREN) 1 % GEL Apply 2 g topically 4 (four) times daily as needed (pain).  Marland Kitchen loratadine (CLARITIN) 10 MG tablet Take 10 mg by mouth daily as needed for allergies.  Marland Kitchen losartan-hydrochlorothiazide (HYZAAR) 100-25 MG tablet Take 1 tablet by mouth daily.  . methocarbamol (ROBAXIN) 500 MG tablet Take 500 mg by mouth as needed for muscle spasms.  . naproxen (NAPROSYN) 500 MG tablet Take 500 mg by mouth daily.  . norethindrone (AYGESTIN) 5 MG tablet Take one tablet po BID until bleeding stops, then take one tablet a day for 10 days.  . ondansetron (ZOFRAN) 4 MG tablet Take by mouth every 8 (eight) hours as needed.   Marland Kitchen PROAIR HFA 108 (90 Base) MCG/ACT inhaler Inhale 1-2 puffs into the lungs every 6 (six) hours as needed for wheezing or shortness of  breath.   . Semaglutide-Weight Management (WEGOVY) 0.5 MG/0.5ML SOAJ Inject into the skin.  Marland Kitchen solifenacin (VESICARE) 5 MG tablet TAKE 1 TABLET (5 MG TOTAL) BY MOUTH DAILY. ONE BY MOUTH EVERY DAY  . traMADol (ULTRAM) 50 MG tablet TAKE 1 TO 2 TABLETS BY MOUTH EVERY 6 TO 8 HOURS AS NEEDED FOR PAIN  . medroxyPROGESTERone (PROVERA) 5 MG tablet Take one tablet daily for 5 days every month if no spontaneous cycle. (Patient not taking: Reported on 06/18/2020)   No facility-administered encounter medications on file as of 06/19/2020.    Allergy:  Allergies  Allergen Reactions  . Ace Inhibitors Cough    Social Hx:   Social History   Socioeconomic History  . Marital status: Married    Spouse name: Not on file  . Number of children: 2  . Years of education: Not on file  . Highest education level: Not on file  Occupational History  . Occupation: admistrative  Tobacco Use  . Smoking status: Never Smoker  . Smokeless tobacco: Never Used  Vaping Use  . Vaping Use: Never used  Substance and Sexual Activity  . Alcohol use: No  . Drug use: No  . Sexual activity: Yes    Partners: Male    Birth control/protection: Surgical    Comment: Vasectomy  Other Topics Concern  . Not  on file  Social History Narrative  . Not on file   Social Determinants of Health   Financial Resource Strain:   . Difficulty of Paying Living Expenses: Not on file  Food Insecurity:   . Worried About Charity fundraiser in the Last Year: Not on file  . Ran Out of Food in the Last Year: Not on file  Transportation Needs:   . Lack of Transportation (Medical): Not on file  . Lack of Transportation (Non-Medical): Not on file  Physical Activity:   . Days of Exercise per Week: Not on file  . Minutes of Exercise per Session: Not on file  Stress:   . Feeling of Stress : Not on file  Social Connections:   . Frequency of Communication with Friends and Family: Not on file  . Frequency of Social Gatherings with Friends  and Family: Not on file  . Attends Religious Services: Not on file  . Active Member of Clubs or Organizations: Not on file  . Attends Archivist Meetings: Not on file  . Marital Status: Not on file  Intimate Partner Violence:   . Fear of Current or Ex-Partner: Not on file  . Emotionally Abused: Not on file  . Physically Abused: Not on file  . Sexually Abused: Not on file    Past Surgical Hx:  Past Surgical History:  Procedure Laterality Date  . CESAREAN SECTION    . INSERTION OF MESH N/A 12/06/2017   Procedure: INSERTION OF MESH;  Surgeon: Ralene Ok, MD;  Location: [facility];  Service: General;  Laterality: N/A;  . LITHOTRIPSY Right 1998  . REPEAT CESAREAN SECTION    . UMBILICAL HERNIA REPAIR N/A 12/06/2017   Procedure: LAPAROSCOPIC UMBILICAL HERNIA;  Surgeon: Ralene Ok, MD;  Location: [facility];  Service: General;  Laterality: N/A;    Past Medical Hx:  Past Medical History:  Diagnosis Date  . Arthritis   . Asthma   . Bladder incontinence    cant hold urine  . Claustrophobia   . Complication of anesthesia    facial itching,"hot spot"  . History of kidney stones   . Hypertension   . Lyme disease 5/14  . Menorrhagia with irregular cycle   . Sleep apnea    cpap  . Spinal stenosis     Past Gynecological History:  See HPI No LMP recorded.  Family Hx:  Family History  Problem Relation Age of Onset  . Liver disease Mother        nonalcoholic fatty liver disease  . Hepatitis C Mother   . Hypertension Mother   . Hyperthyroidism Mother   . Cancer Father        lung cancer  . Hypothyroidism Father   . ALS Sister   . Mental illness Sister   . Breast cancer Maternal Grandmother     Review of Systems:  Constitutional  Feels well,    ENT Normal appearing ears and nares bilaterally Skin/Breast  No rash, sores, jaundice, itching, dryness Cardiovascular  No chest pain, shortness of breath, or edema  Pulmonary  No cough or wheeze.  Gastro Intestinal   No nausea, vomitting, or diarrhoea. No bright red blood per rectum, no abdominal pain, change in bowel movement, or constipation.  Genito Urinary  No frequency, urgency, dysuria, + postmenopausal bleeding Musculo Skeletal  No myalgia, arthralgia, joint swelling or pain  Neurologic  No weakness, numbness, change in gait,  Psychology  No depression, anxiety, insomnia.   Vitals:  Blood  pressure 126/88, pulse 95, temperature (!) 97.4 F (36.3 C), temperature source Tympanic, resp. rate 18, height '5\' 6"'  (1.676 m), weight 300 lb (136.1 kg), SpO2 100 %.  Physical Exam: WD in NAD Neck  Supple NROM, without any enlargements.  Lymph Node Survey No cervical supraclavicular or inguinal adenopathy Cardiovascular  Pulse normal rate, regularity and rhythm. S1 and S2 normal.  Lungs  Clear to auscultation bilateraly, without wheezes/crackles/rhonchi. Good air movement.  Skin  No rash/lesions/breakdown  Psychiatry  Alert and oriented to person, place, and time  Abdomen  Normoactive bowel sounds, abdomen soft, non-tender and obese without evidence of hernia. No pannus. Low transverse incision. Back No CVA tenderness Genito Urinary  Vulva/vagina: Normal external female genitalia.  No lesions. No discharge or bleeding.  Bladder/urethra:  No lesions or masses, well supported bladder  Vagina: normal, long  Cervix: small, unable to visualize, palpably normal.  Uterus: unable to discern size due to body habitus. no parametrial involvement or nodularity.  Adnexa: no palpable masses. Rectal  deferred Extremities  No bilateral cyanosis, clubbing or edema.  60 minutes of total time was spent for this patient encounter, including preparation, face-to-face counseling with the patient and coordination of care, and documentation of the encounter.   Thereasa Solo, MD  06/19/2020, 10:42 AM

## 2020-06-19 NOTE — Progress Notes (Signed)
Requested hypermethylation testing on accession [id number] with [facility] via email.

## 2020-06-19 NOTE — Patient Instructions (Signed)
Preparing for your Surgery  Plan for surgery on July 21, 2020 with Dr. Everitt Amber at [place] will be scheduled for a robotic assisted total laparoscopic hysterectomy (removal of the uterus and cervix), bilateral salpingo-oophorectomy (removal of both ovaries and fallopian tubes), sentinel lymph node biopsy, lysis of adhesions, possible mini laparotomy (larger incision on your abdomen).   Pre-operative Testing -You will receive a phone call from presurgical testing at [facility] to arrange for a pre-operative appointment, lab appointment, and COVID test. The COVID test normally happens 3 days prior to the surgery and they ask that you self quarantine after the test up until surgery to decrease chance of exposure.  -Bring your insurance card, copy of an advanced directive if applicable, medication list  -At that visit, you will be asked to sign a consent for a possible blood transfusion in case a transfusion becomes necessary during surgery.  The need for a blood transfusion is rare but having consent is a necessary part of your care.     -You should not be taking blood thinners or aspirin at least ten days prior to surgery unless instructed by your surgeon.  -Do not take supplements such as fish oil (omega 3), red yeast rice, turmeric before your surgery. You will need to stop taking semaglutide and naproxen now. You can use Tylenol in place of NSAIDS.  Day Before Surgery at [place] will be asked to take in a light diet the day before surgery. You will be advised you can have clear liquids up until 3 hours before your surgery.    Eat a light diet the day before surgery.  Examples including soups, broths, toast, yogurt, mashed potatoes.  AVOID GAS PRODUCING FOODS. Things to avoid include carbonated beverages (fizzy beverages), raw fruits and raw vegetables, or beans.   If your bowels are filled with gas, your surgeon will have difficulty visualizing your pelvic  organs which increases your surgical risks.  Your role in recovery Your role is to become active as soon as directed by your doctor, while still giving yourself time to heal.  Rest when you feel tired. You will be asked to do the following in order to speed your recovery:  - Cough and breathe deeply. This helps to clear and expand your lungs and can prevent pneumonia after surgery.  - [place]. Do mild physical activity. Walking or moving your legs help your circulation and body functions return to normal. Do not try to get up or walk alone the first time after surgery.   -If you develop swelling on one leg or the other, pain in the back of your leg, redness/warmth in one of your legs, please call the office or go to the Emergency Room to have a doppler to rule out a blood clot. For shortness of breath, chest pain-seek care in the Emergency Room as soon as possible. - Actively manage your pain. Managing your pain lets you move in comfort. We will ask you to rate your pain on a scale of zero to 10. It is your responsibility to tell your doctor or nurse where and how much you hurt so your pain can be treated.  Special Considerations -If you are diabetic, you may be placed on insulin after surgery to have closer control over your blood sugars to promote healing and recovery.  This does not mean that you will be discharged on insulin.  If applicable, your oral antidiabetics will be  resumed when you are tolerating a solid diet.  -Your final pathology results from surgery should be available around one week after surgery and the results will be relayed to you when available.  -Dr. [name] is the surgeon that assists your GYN Oncologist with surgery.  If you end up staying the night, the next day after your surgery you will either see Dr. Denman George, Dr. Berline Lopes, or Dr. [name].  -FMLA forms can be faxed to [phone] and please allow 5-7 business days for  completion.  Pain Management After Surgery -You have been prescribed your pain medication and bowel regimen medications before surgery so that you can have these available when you are discharged from the hospital. The pain medication is for use ONLY AFTER surgery and a new prescription will not be given.   -Make sure that you have Tylenol and Ibuprofen at home to use on a regular basis after surgery for pain control. We recommend alternating the medications every hour to six hours since they work differently and are processed in the body differently for pain relief.  -Review the attached handout on narcotic use and their risks and side effects.   Bowel Regimen -You have been prescribed Sennakot-S to take nightly to prevent constipation especially if you are taking the narcotic pain medication intermittently.  It is important to prevent constipation and drink adequate amounts of liquids. You can stop taking this medication when you are not taking pain medication and you are back on your normal bowel routine.  Risks of Surgery Risks of surgery are low but include bleeding, infection, damage to surrounding structures, re-operation, blood clots, and very rarely death.   Blood Transfusion Information (For the consent to be signed before surgery)  We will be checking your blood type before surgery so in case of emergencies, we will know what type of blood you would need.                                            WHAT IS A BLOOD TRANSFUSION?  A transfusion is the replacement of blood or some of its parts. Blood is made up of multiple cells which provide different functions.  Red blood cells carry oxygen and are used for blood loss replacement.  White blood cells fight against infection.  Platelets control bleeding.  Plasma helps clot blood.  Other blood products are available for specialized needs, such as hemophilia or other clotting disorders. BEFORE THE TRANSFUSION  Who gives blood  for transfusions?   You may be able to donate blood to be used at a later date on yourself (autologous donation).  Relatives can be asked to donate blood. This is generally not any safer than if you have received blood from a stranger. The same precautions are taken to ensure safety when a relative's blood is donated.  Healthy volunteers who are fully evaluated to make sure their blood is safe. This is blood bank blood. Transfusion therapy is the safest it has ever been in the practice of medicine. Before blood is taken from a donor, a complete history is taken to make sure that person has no history of diseases nor engages in risky social behavior (examples are intravenous drug use or sexual activity with multiple partners). The donor's travel history is screened to minimize risk of transmitting infections, such as malaria. The donated blood is tested for signs  of infectious diseases, such as HIV and hepatitis. The blood is then tested to be sure it is compatible with you in order to minimize the chance of a transfusion reaction. If you or a relative donates blood, this is often done in anticipation of surgery and is not appropriate for emergency situations. It takes many days to process the donated blood. RISKS AND COMPLICATIONS Although transfusion therapy is very safe and saves many lives, the main dangers of transfusion include:   Getting an infectious disease.  Developing a transfusion reaction. This is an allergic reaction to something in the blood you were given. Every precaution is taken to prevent this. The decision to have a blood transfusion has been considered carefully by your caregiver before blood is given. Blood is not given unless the benefits outweigh the risks.  AFTER SURGERY INSTRUCTIONS  Return to work: 4 weeks if applicable  Activity: 1. Be up and out of the bed during the day.  Take a nap if needed.  You may walk up steps but be careful and use the hand rail.  Stair  climbing will tire you more than you think, you may need to stop part way and rest.   2. No lifting or straining for 6 weeks over 10 pounds. No pushing, pulling, straining for 6 weeks.  3. No driving for 1 week(s).  Do not drive if you are taking narcotic pain medicine and make sure that your reaction time has returned.   4. You can shower as soon as the next day after surgery. Shower daily.  Use your regular soap and water (not directly on the incision) and pat your incision(s) dry afterwards; don't rub.  No tub baths or submerging your body in water until cleared by your surgeon. If you have the soap that was given to you by pre-surgical testing that was used before surgery, you do not need to use it afterwards because this can irritate your incisions.   5. No sexual activity and nothing in the vagina for 8 weeks.  6. You may experience a small amount of clear drainage from your incisions, which is normal.  If the drainage persists, increases, or changes color please call the office.  7. Do not use creams, lotions, or ointments such as neosporin on your incisions after surgery until advised by your surgeon because they can cause removal of the dermabond glue on your incisions.    8. You may experience vaginal spotting after surgery or around the 6-8 week mark from surgery when the stitches at the top of the vagina begin to dissolve.  The spotting is normal but if you experience heavy bleeding, call our office.  9. Take Tylenol or ibuprofen first for pain and only use narcotic pain medication for severe pain not relieved by the Tylenol or Ibuprofen.  Monitor your Tylenol intake to a max of 4,000 mg in a 24 hour period. You can alternate these medications after surgery.  Diet: 1. Low sodium Heart Healthy Diet is recommended but you are cleared to resume your normal (before surgery) diet after your procedure.  2. It is safe to use a laxative, such as Miralax or Colace, if you have difficulty  moving your bowels. You have been prescribed Sennakot at bedtime every evening to keep bowel movements regular and to prevent constipation.    Wound Care: 1. Keep clean and dry.  Shower daily.  Reasons to call the Doctor:  Fever - Oral temperature greater than 100.4 degrees Fahrenheit  Foul-smelling vaginal discharge  Difficulty urinating  Nausea and vomiting  Increased pain at the site of the incision that is unrelieved with pain medicine.  Difficulty breathing with or without chest pain  New calf pain especially if only on one side  Sudden, continuing increased vaginal bleeding with or without clots.   Contacts: For questions or concerns you should contact:  Dr. Everitt Amber at [phone]  [name], NP at [phone]  After Hours: call [phone] and have the GYN Oncologist paged/contacted (after 5 pm or on the weekends)

## 2020-06-22 ENCOUNTER — Other Ambulatory Visit: Payer: Self-pay | Admitting: Obstetrics and Gynecology

## 2020-06-23 DIAGNOSIS — C541 Malignant neoplasm of endometrium: Secondary | ICD-10-CM | POA: Diagnosis not present

## 2020-06-23 DIAGNOSIS — Z713 Dietary counseling and surveillance: Secondary | ICD-10-CM | POA: Diagnosis not present

## 2020-06-23 DIAGNOSIS — Z6841 Body Mass Index (BMI) 40.0 and over, adult: Secondary | ICD-10-CM | POA: Diagnosis not present

## 2020-06-23 DIAGNOSIS — I10 Essential (primary) hypertension: Secondary | ICD-10-CM | POA: Diagnosis not present

## 2020-06-23 DIAGNOSIS — R7303 Prediabetes: Secondary | ICD-10-CM | POA: Diagnosis not present

## 2020-07-09 ENCOUNTER — Telehealth: Payer: Self-pay

## 2020-07-09 NOTE — Telephone Encounter (Signed)
LM that Dr.Rossi will give her husband FMLA for 1 week from surgery not until 09-28-20.  If any questions or concerns, they can call back to the office.

## 2020-07-10 NOTE — Telephone Encounter (Signed)
Faxed completed FMLA forms to Mr. Locey.

## 2020-07-10 NOTE — Progress Notes (Signed)
DUE TO COVID-19 ONLY ONE VISITOR IS ALLOWED TO COME WITH YOU AND STAY IN THE WAITING ROOM ONLY DURING PRE OP AND PROCEDURE DAY OF SURGERY. THE 1 VISITOR  MAY VISIT WITH YOU AFTER SURGERY IN YOUR PRIVATE ROOM DURING VISITING HOURS ONLY!  YOU NEED TO HAVE A COVID 19 TEST ON__12/17/2021 _____ @_______ , THIS TEST MUST BE DONE BEFORE SURGERY,  COVID TESTING SITE [address] [place] [address], IT IS ON THE RIGHT GOING OUT [address] APPROXIMATELY  2 MINUTES PAST [employer] ON THE RIGHT. ONCE YOUR COVID TEST IS COMPLETED,  PLEASE BEGIN THE QUARANTINE INSTRUCTIONS AS OUTLINED IN YOUR HANDOUT.                Katelyn Cohen  07/10/2020   Your procedure is scheduled on: 07/21/2020    Report to [facility] Main  Entrance   Report to admitting at    [place] AM     Call this number if you have problems the morning of surgery [phone]    Remember: Do not eat food , candy gum or mints :After Midnight. You may have clear liquids from midnight until 0430am     CLEAR LIQUID DIET   Foods Allowed                                                                       Coffee and tea, regular and decaf                              Plain Jell-O any favor except red or purple                                            Fruit ices (not with fruit pulp)                                      Iced Popsicles                                     Carbonated beverages, regular and diet                                    Cranberry, grape and apple juices Sports drinks like Gatorade Lightly seasoned clear broth or consume(fat free) Sugar, honey syrup   _____________________________________________________________________    BRUSH YOUR TEETH MORNING OF SURGERY AND RINSE YOUR MOUTH OUT, NO CHEWING GUM CANDY OR MINTS.     Take these medicines the morning of surgery with A SIP OF WATER: zyrtec if needed, [place] as usual and bring   DO NOT TAKE ANY DIABETIC MEDICATIONS DAY OF  YOUR SURGERY                               You may  not have any metal on your body including hair pins and              piercings  Do not wear jewelry, make-up, lotions, powders or perfumes, deodorant             Do not wear nail polish on your fingernails.  Do not shave  48 hours prior to surgery.              Men may shave face and neck.   Do not bring valuables to the hospital. [place].  Contacts, dentures or bridgework may not be worn into surgery.  Leave suitcase in the car. After surgery it may be brought to your room.     Patients discharged the day of surgery will not be allowed to drive home. IF YOU ARE HAVING SURGERY AND GOING HOME THE SAME DAY, YOU MUST HAVE AN ADULT TO DRIVE YOU HOME AND BE WITH YOU FOR 24 HOURS. YOU MAY GO HOME BY TAXI OR UBER OR ORTHERWISE, BUT AN ADULT MUST ACCOMPANY YOU HOME AND STAY WITH YOU FOR 24 HOURS.  Name and phone number of your driver:  Special Instructions: N/A              Please read over the following fact sheets you were given: _____________________________________________________________________  [facility] - Preparing for Surgery Before surgery, you can play an important role.  Because skin is not sterile, your skin needs to be as free of germs as possible.  You can reduce the number of germs on your skin by washing with CHG (chlorahexidine gluconate) soap before surgery.  CHG is an antiseptic cleaner which kills germs and bonds with the skin to continue killing germs even after washing. Please DO NOT use if you have an allergy to CHG or antibacterial soaps.  If your skin becomes reddened/irritated stop using the CHG and inform your nurse when you arrive at Short Stay. Do not shave (including legs and underarms) for at least 48 hours prior to the first CHG shower.  You may shave your face/neck. Please follow these instructions carefully:  1.  Shower with CHG Soap the night before surgery and  the  morning of Surgery.  2.  If you choose to wash your hair, wash your hair first as usual with your  normal  shampoo.  3.  After you shampoo, rinse your hair and body thoroughly to remove the  shampoo.                           4.  Use CHG as you would any other liquid soap.  You can apply chg directly  to the skin and wash                       Gently with a scrungie or clean washcloth.  5.  Apply the CHG Soap to your body ONLY FROM THE NECK DOWN.   Do not use on face/ open                           Wound or open sores. Avoid contact with eyes, ears mouth and genitals (private parts).  Wash face,  Genitals (private parts) with your normal soap.             6.  Wash thoroughly, paying special attention to the area where your surgery  will be performed.  7.  Thoroughly rinse your body with warm water from the neck down.  8.  DO NOT shower/wash with your normal soap after using and rinsing off  the CHG Soap.                9.  Pat yourself dry with a clean towel.            10.  Wear clean pajamas.            11.  Place clean sheets on your bed the night of your first shower and do not  sleep with pets. Day of Surgery : Do not apply any lotions/deodorants the morning of surgery.  Please wear clean clothes to the hospital/surgery center.  FAILURE TO FOLLOW THESE INSTRUCTIONS MAY RESULT IN THE CANCELLATION OF YOUR SURGERY PATIENT SIGNATURE_________________________________  NURSE SIGNATURE__________________________________  ________________________________________________________________________

## 2020-07-13 ENCOUNTER — Other Ambulatory Visit: Payer: Self-pay

## 2020-07-13 ENCOUNTER — Encounter (HOSPITAL_COMMUNITY): Payer: Self-pay

## 2020-07-13 ENCOUNTER — Encounter (HOSPITAL_COMMUNITY)
Admission: RE | Admit: 2020-07-13 | Discharge: 2020-07-13 | Disposition: A | Discharge status: Home / Self Care | Payer: BC Managed Care – PPO | Source: Ambulatory Visit | Attending: Gynecologic Oncology | Admitting: Gynecologic Oncology

## 2020-07-13 DIAGNOSIS — Z01818 Encounter for other preprocedural examination: Secondary | ICD-10-CM | POA: Insufficient documentation

## 2020-07-13 HISTORY — DX: Gastro-esophageal reflux disease without esophagitis: K21.9

## 2020-07-13 LAB — URINALYSIS, ROUTINE W REFLEX MICROSCOPIC
Bilirubin Urine: NEGATIVE
Glucose, UA: NEGATIVE mg/dL
Ketones, ur: NEGATIVE mg/dL
Leukocytes,Ua: NEGATIVE
Nitrite: NEGATIVE
Protein, ur: NEGATIVE mg/dL
Specific Gravity, Urine: 1.024 (ref 1.005–1.030)
pH: 5 (ref 5.0–8.0)

## 2020-07-13 LAB — HEMOGLOBIN A1C
Hgb A1c MFr Bld: 5.3 % (ref 4.8–5.6)
Mean Plasma Glucose: 105.41 mg/dL

## 2020-07-13 LAB — COMPREHENSIVE METABOLIC PANEL
ALT: 15 U/L (ref 0–44)
AST: 16 U/L (ref 15–41)
Albumin: 3.9 g/dL (ref 3.5–5.0)
Alkaline Phosphatase: 53 U/L (ref 38–126)
Anion gap: 10 (ref 5–15)
BUN: 17 mg/dL (ref 6–20)
CO2: 26 mmol/L (ref 22–32)
Calcium: 9.3 mg/dL (ref 8.9–10.3)
Chloride: 105 mmol/L (ref 98–111)
Creatinine, Ser: 0.77 mg/dL (ref 0.44–1.00)
GFR, Estimated: >60 mL/min (ref >60)
Glucose, Bld: 113 mg/dL — ABNORMAL HIGH (ref 70–99)
Potassium: 4.2 mmol/L (ref 3.5–5.1)
Sodium: 141 mmol/L (ref 135–145)
Total Bilirubin: 0.6 mg/dL (ref 0.3–1.2)
Total Protein: 7.3 g/dL (ref 6.5–8.1)

## 2020-07-13 LAB — CBC
HCT: 39.3 % (ref 36.0–46.0)
Hemoglobin: 12.5 g/dL (ref 12.0–15.0)
MCH: 27.9 pg (ref 26.0–34.0)
MCHC: 31.8 g/dL (ref 30.0–36.0)
MCV: 87.7 fL (ref 80.0–100.0)
Platelets: 341 10*3/uL (ref 150–400)
RBC: 4.48 MIL/uL (ref 3.87–5.11)
RDW: 13.2 % (ref 11.5–15.5)
WBC: 6.8 10*3/uL (ref 4.0–10.5)
nRBC: 0 % (ref 0.0–0.2)

## 2020-07-13 NOTE — Progress Notes (Signed)
U/A done 07/13/2020 routed to Dr Denman George and Zoila Shutter

## 2020-07-14 ENCOUNTER — Telehealth: Payer: Self-pay

## 2020-07-14 NOTE — Progress Notes (Signed)
Final ekg done 07/13/20- epic

## 2020-07-14 NOTE — Telephone Encounter (Signed)
Katelyn Cohen stated that her husband would use holiday time for the week off for surgery. Told her that the FMLA forms were already sent in for him. Told her that her forms had not been received. She will re fax to [phone] or [phone]. Told her that she will not go home with an abdominal binder.  She can call the office if she feels she needs one and she can come in and be fitted for one. Told her that [facility] Sci[facility] can order an incentive spirometer but with out patient surgery the spirometer mis not usually given.  She can ask them for one in the recovery room.Told her that the ultram and senokot-s prescriptions will be ready at her pharmacy after 2 pm today. Pt with hx  left frozen shoulder and nerve impingement in right shoulder.  She cannot have her arms above her head. She also has spinal stenosis in her neck. Told her that her head would be in alinement  And that her arms would be tucked by her side. [name], NP said this should not affect her neck and shoulder issues but cannot guarantee that there would not be any symptoms post op. Katelyn Pinon verbalized understanding.

## 2020-07-14 NOTE — Telephone Encounter (Signed)
LM to call back to discuss questions patient has regarding surgery and FMLA.

## 2020-07-17 ENCOUNTER — Other Ambulatory Visit (HOSPITAL_COMMUNITY)
Admission: RE | Admit: 2020-07-17 | Discharge: 2020-07-17 | Disposition: A | Discharge status: Home / Self Care | Payer: BC Managed Care – PPO | Source: Ambulatory Visit | Attending: Gynecologic Oncology | Admitting: Gynecologic Oncology

## 2020-07-17 ENCOUNTER — Other Ambulatory Visit (HOSPITAL_COMMUNITY): Payer: BC Managed Care – PPO

## 2020-07-17 DIAGNOSIS — Z20822 Contact with and (suspected) exposure to covid-19: Secondary | ICD-10-CM | POA: Diagnosis not present

## 2020-07-17 DIAGNOSIS — Z01812 Encounter for preprocedural laboratory examination: Secondary | ICD-10-CM | POA: Diagnosis not present

## 2020-07-18 LAB — SARS CORONAVIRUS 2 (TAT 6-24 HRS): SARS Coronavirus 2: NEGATIVE

## 2020-07-20 ENCOUNTER — Telehealth: Payer: Self-pay

## 2020-07-20 MED ORDER — DEXTROSE 5 % IV SOLN
3.0000 g | INTRAVENOUS | Status: AC
  Administered 2020-07-21: 3 g via INTRAVENOUS
  Filled 2020-07-20: qty 3

## 2020-07-20 NOTE — Telephone Encounter (Signed)
Katelyn Cohen understands her written pre op instructions. She has not eaten today.  Told her that she can have regular food today but eat lightly. Avoid gas producing foods.  She can have clear liquids after midnight until 0430 tomorrow. Pt verbalized understanding.

## 2020-07-20 NOTE — Anesthesia Preprocedure Evaluation (Addendum)
Anesthesia Evaluation  Patient identified by MRN, date of birth, ID band Patient awake  General Assessment Comment:Complication of anesthesia  facial itching,"hot spot"   Reviewed: Allergy & Precautions, H&P , NPO status , Patient's Chart, lab work & pertinent test results  History of Anesthesia Complications (+) history of anesthetic complications  Airway Mallampati: III  TM Distance: >3 FB Neck ROM: Full    Dental no notable dental hx. (+) Teeth Intact, Dental Advisory Given   Pulmonary asthma , sleep apnea and Continuous Positive Airway Pressure Ventilation ,    Pulmonary exam normal breath sounds clear to auscultation       Cardiovascular hypertension, Pt. on medications Normal cardiovascular exam Rhythm:Regular Rate:Normal     Neuro/Psych PSYCHIATRIC DISORDERS Anxiety negative neurological ROS     GI/Hepatic Neg liver ROS, GERD  Medicated and Controlled,  Endo/Other  negative endocrine ROSMorbid obesity  Renal/GU negative Renal ROS  negative genitourinary   Musculoskeletal  (+) Arthritis ,   Abdominal   Peds negative pediatric ROS (+)  Hematology negative hematology ROS (+)   Anesthesia Other Findings   Reproductive/Obstetrics negative OB ROS                            Anesthesia Physical  Anesthesia Plan  ASA: III  Anesthesia Plan: General   Post-op Pain Management:    Induction: Intravenous  PONV Risk Score and Plan: 3 and Ondansetron, Dexamethasone, Midazolam, Treatment may vary due to age or medical condition and Diphenhydramine  Airway Management Planned: Oral ETT  Additional Equipment:   Intra-op Plan:   Post-operative Plan: Extubation in OR  Informed Consent: I have reviewed the patients History and Physical, chart, labs and discussed the procedure including the risks, benefits and alternatives for the proposed anesthesia with the patient or authorized  representative who has indicated his/her understanding and acceptance.       Plan Discussed with: CRNA, Anesthesiologist and Surgeon  Anesthesia Plan Comments:        Anesthesia Quick Evaluation

## 2020-07-21 ENCOUNTER — Other Ambulatory Visit: Payer: Self-pay

## 2020-07-21 ENCOUNTER — Ambulatory Visit (HOSPITAL_COMMUNITY): Payer: BC Managed Care – PPO | Admitting: Anesthesiology

## 2020-07-21 ENCOUNTER — Encounter (HOSPITAL_COMMUNITY): Payer: Self-pay | Admitting: Gynecologic Oncology

## 2020-07-21 ENCOUNTER — Ambulatory Visit (HOSPITAL_COMMUNITY)
Admission: RE | Admit: 2020-07-21 | Discharge: 2020-07-21 | Disposition: A | Discharge status: Home / Self Care | Payer: BC Managed Care – PPO | Source: Ambulatory Visit | Attending: Gynecologic Oncology | Admitting: Gynecologic Oncology

## 2020-07-21 ENCOUNTER — Encounter (HOSPITAL_COMMUNITY)
Admission: RE | Disposition: A | Discharge status: Home / Self Care | Payer: Self-pay | Source: Ambulatory Visit | Attending: Gynecologic Oncology

## 2020-07-21 DIAGNOSIS — I10 Essential (primary) hypertension: Secondary | ICD-10-CM | POA: Diagnosis not present

## 2020-07-21 DIAGNOSIS — Z6841 Body Mass Index (BMI) 40.0 and over, adult: Secondary | ICD-10-CM | POA: Diagnosis not present

## 2020-07-21 DIAGNOSIS — N736 Female pelvic peritoneal adhesions (postinfective): Secondary | ICD-10-CM | POA: Diagnosis not present

## 2020-07-21 DIAGNOSIS — J45909 Unspecified asthma, uncomplicated: Secondary | ICD-10-CM | POA: Diagnosis not present

## 2020-07-21 DIAGNOSIS — E559 Vitamin D deficiency, unspecified: Secondary | ICD-10-CM | POA: Diagnosis not present

## 2020-07-21 DIAGNOSIS — D251 Intramural leiomyoma of uterus: Secondary | ICD-10-CM | POA: Diagnosis not present

## 2020-07-21 DIAGNOSIS — D259 Leiomyoma of uterus, unspecified: Secondary | ICD-10-CM | POA: Insufficient documentation

## 2020-07-21 DIAGNOSIS — N800 Endometriosis of uterus: Secondary | ICD-10-CM | POA: Diagnosis not present

## 2020-07-21 DIAGNOSIS — C541 Malignant neoplasm of endometrium: Secondary | ICD-10-CM | POA: Diagnosis not present

## 2020-07-21 DIAGNOSIS — Z791 Long term (current) use of non-steroidal anti-inflammatories (NSAID): Secondary | ICD-10-CM | POA: Insufficient documentation

## 2020-07-21 DIAGNOSIS — Z79899 Other long term (current) drug therapy: Secondary | ICD-10-CM | POA: Diagnosis not present

## 2020-07-21 DIAGNOSIS — G8918 Other acute postprocedural pain: Secondary | ICD-10-CM

## 2020-07-21 HISTORY — PX: SENTINEL NODE BIOPSY: SHX6608

## 2020-07-21 HISTORY — PX: ROBOTIC ASSISTED TOTAL HYSTERECTOMY WITH BILATERAL SALPINGO OOPHERECTOMY: SHX6086

## 2020-07-21 HISTORY — PX: LYSIS OF ADHESION: SHX5961

## 2020-07-21 LAB — ABO/RH: ABO/RH(D): A POS

## 2020-07-21 LAB — TYPE AND SCREEN
ABO/RH(D): A POS
Antibody Screen: NEGATIVE

## 2020-07-21 LAB — HEMOGLOBIN A1C
Hgb A1c MFr Bld: 5.3 % (ref 4.8–5.6)
Mean Plasma Glucose: 105.41 mg/dL

## 2020-07-21 LAB — PREGNANCY, URINE: Preg Test, Ur: NEGATIVE

## 2020-07-21 LAB — HCG, SERUM, QUALITATIVE: Preg, Serum: NEGATIVE

## 2020-07-21 SURGERY — HYSTERECTOMY, TOTAL, ROBOT-ASSISTED, LAPAROSCOPIC, WITH BILATERAL SALPINGO-OOPHORECTOMY
Anesthesia: General | Site: Abdomen

## 2020-07-21 MED ORDER — ACETAMINOPHEN 500 MG PO TABS
1000.0000 mg | ORAL_TABLET | ORAL | Status: AC
  Administered 2020-07-21: 1000 mg via ORAL
  Filled 2020-07-21: qty 2

## 2020-07-21 MED ORDER — ROCURONIUM BROMIDE 10 MG/ML (PF) SYRINGE
PREFILLED_SYRINGE | INTRAVENOUS | Status: AC
  Filled 2020-07-21: qty 10

## 2020-07-21 MED ORDER — ENOXAPARIN SODIUM 40 MG/0.4ML ~~LOC~~ SOLN
40.0000 mg | SUBCUTANEOUS | Status: AC
  Administered 2020-07-21: 40 mg via SUBCUTANEOUS
  Filled 2020-07-21: qty 0.4

## 2020-07-21 MED ORDER — ACETAMINOPHEN 325 MG PO TABS: 325.0000 mg | ORAL_TABLET | ORAL | Status: DC | PRN

## 2020-07-21 MED ORDER — STERILE WATER FOR INJECTION IJ SOLN
INTRAMUSCULAR | Status: AC
  Filled 2020-07-21: qty 10

## 2020-07-21 MED ORDER — LIDOCAINE HCL (PF) 2 % IJ SOLN
INTRAMUSCULAR | Status: AC
  Filled 2020-07-21: qty 5

## 2020-07-21 MED ORDER — FENTANYL CITRATE (PF) 250 MCG/5ML IJ SOLN
INTRAMUSCULAR | Status: DC | PRN
  Administered 2020-07-21: 100 ug via INTRAVENOUS
  Administered 2020-07-21 (×2): 50 ug via INTRAVENOUS
  Administered 2020-07-21: 150 ug via INTRAVENOUS
  Administered 2020-07-21: 100 ug via INTRAVENOUS
  Administered 2020-07-21: 50 ug via INTRAVENOUS

## 2020-07-21 MED ORDER — CELECOXIB 200 MG PO CAPS
400.0000 mg | ORAL_CAPSULE | ORAL | Status: AC
  Administered 2020-07-21: 400 mg via ORAL
  Filled 2020-07-21: qty 2

## 2020-07-21 MED ORDER — MIDAZOLAM HCL 2 MG/2ML IJ SOLN
INTRAMUSCULAR | Status: AC
  Filled 2020-07-21: qty 2

## 2020-07-21 MED ORDER — BUPIVACAINE HCL 0.25 % IJ SOLN
INTRAMUSCULAR | Status: AC
  Filled 2020-07-21: qty 1

## 2020-07-21 MED ORDER — MEPERIDINE HCL 50 MG/ML IJ SOLN
INTRAMUSCULAR | Status: AC
  Filled 2020-07-21: qty 1

## 2020-07-21 MED ORDER — ONDANSETRON HCL 4 MG/2ML IJ SOLN
INTRAMUSCULAR | Status: AC
  Filled 2020-07-21: qty 2

## 2020-07-21 MED ORDER — LACTATED RINGERS IV SOLN: INTRAVENOUS | Status: DC

## 2020-07-21 MED ORDER — ACETAMINOPHEN 160 MG/5ML PO SOLN
325.0000 mg | ORAL | Status: DC | PRN
Start: 2020-07-21 — End: 2020-07-21

## 2020-07-21 MED ORDER — MEPERIDINE HCL 50 MG/ML IJ SOLN
6.2500 mg | INTRAMUSCULAR | Status: DC | PRN
  Administered 2020-07-21: 12.5 mg via INTRAVENOUS

## 2020-07-21 MED ORDER — FENTANYL CITRATE (PF) 250 MCG/5ML IJ SOLN
INTRAMUSCULAR | Status: AC
  Filled 2020-07-21: qty 5

## 2020-07-21 MED ORDER — DIPHENHYDRAMINE HCL 50 MG/ML IJ SOLN
INTRAMUSCULAR | Status: DC | PRN
  Administered 2020-07-21: 12.5 mg via INTRAVENOUS

## 2020-07-21 MED ORDER — STERILE WATER FOR INJECTION IJ SOLN
INTRAMUSCULAR | Status: DC | PRN
  Administered 2020-07-21: 5 mL via INTRAVENOUS

## 2020-07-21 MED ORDER — SCOPOLAMINE 1 MG/3DAYS TD PT72
1.0000 | MEDICATED_PATCH | TRANSDERMAL | Status: DC
  Administered 2020-07-21: 1.5 mg via TRANSDERMAL
  Filled 2020-07-21: qty 1

## 2020-07-21 MED ORDER — LIDOCAINE HCL (CARDIAC) PF 100 MG/5ML IV SOSY
PREFILLED_SYRINGE | INTRAVENOUS | Status: DC | PRN
  Administered 2020-07-21: 80 mg via INTRAVENOUS

## 2020-07-21 MED ORDER — PROPOFOL 10 MG/ML IV BOLUS
INTRAVENOUS | Status: AC
  Filled 2020-07-21: qty 40

## 2020-07-21 MED ORDER — BUPIVACAINE LIPOSOME 1.3 % IJ SUSP
20.0000 mL | Freq: Once | INTRAMUSCULAR | Status: AC
  Administered 2020-07-21: 20 mL
  Filled 2020-07-21: qty 20

## 2020-07-21 MED ORDER — DEXMEDETOMIDINE (PRECEDEX) IN NS 20 MCG/5ML (4 MCG/ML) IV SYRINGE
PREFILLED_SYRINGE | INTRAVENOUS | Status: DC | PRN
  Administered 2020-07-21 (×5): 4 ug via INTRAVENOUS

## 2020-07-21 MED ORDER — TRAMADOL HCL 50 MG PO TABS: 50.0000 mg | ORAL_TABLET | Freq: Four times a day (QID) | ORAL | Status: DC | PRN

## 2020-07-21 MED ORDER — LIDOCAINE 2% (20 MG/ML) 5 ML SYRINGE
INTRAMUSCULAR | Status: DC | PRN
  Administered 2020-07-21: 4.43 mL/h via INTRAVENOUS

## 2020-07-21 MED ORDER — KETAMINE HCL 10 MG/ML IJ SOLN
INTRAMUSCULAR | Status: AC
  Filled 2020-07-21: qty 1

## 2020-07-21 MED ORDER — STERILE WATER FOR IRRIGATION IR SOLN
Status: DC | PRN
  Administered 2020-07-21: 1000 mL

## 2020-07-21 MED ORDER — ROCURONIUM BROMIDE 10 MG/ML (PF) SYRINGE
PREFILLED_SYRINGE | INTRAVENOUS | Status: DC | PRN
  Administered 2020-07-21: 70 mg via INTRAVENOUS
  Administered 2020-07-21: 20 mg via INTRAVENOUS

## 2020-07-21 MED ORDER — CHLORHEXIDINE GLUCONATE 0.12 % MT SOLN
15.0000 mL | Freq: Once | OROMUCOSAL | Status: AC
  Administered 2020-07-21: 15 mL via OROMUCOSAL

## 2020-07-21 MED ORDER — OXYCODONE HCL 5 MG/5ML PO SOLN: 5.0000 mg | Freq: Once | ORAL | Status: DC | PRN

## 2020-07-21 MED ORDER — INDOCYANINE GREEN 25 MG IV SOLR
INTRAVENOUS | Status: AC
  Filled 2020-07-21: qty 10

## 2020-07-21 MED ORDER — ORAL CARE MOUTH RINSE: 15.0000 mL | Freq: Once | OROMUCOSAL | Status: AC

## 2020-07-21 MED ORDER — FENTANYL CITRATE (PF) 100 MCG/2ML IJ SOLN
INTRAMUSCULAR | Status: AC
  Filled 2020-07-21: qty 2

## 2020-07-21 MED ORDER — MIDAZOLAM HCL 2 MG/2ML IJ SOLN
INTRAMUSCULAR | Status: DC | PRN
  Administered 2020-07-21: 2 mg via INTRAVENOUS

## 2020-07-21 MED ORDER — FENTANYL CITRATE (PF) 100 MCG/2ML IJ SOLN
25.0000 ug | INTRAMUSCULAR | Status: DC | PRN
  Administered 2020-07-21 (×3): 50 ug via INTRAVENOUS

## 2020-07-21 MED ORDER — ONDANSETRON HCL 4 MG/2ML IJ SOLN
INTRAMUSCULAR | Status: DC | PRN
  Administered 2020-07-21: 4 mg via INTRAVENOUS

## 2020-07-21 MED ORDER — SODIUM CHLORIDE 0.9% FLUSH: 3.0000 mL | Freq: Two times a day (BID) | INTRAVENOUS | Status: DC

## 2020-07-21 MED ORDER — ONDANSETRON HCL 4 MG/2ML IJ SOLN
4.0000 mg | Freq: Once | INTRAMUSCULAR | Status: AC | PRN
  Administered 2020-07-21: 4 mg via INTRAVENOUS

## 2020-07-21 MED ORDER — SODIUM CHLORIDE (PF) 0.9 % IJ SOLN
INTRAMUSCULAR | Status: AC
  Filled 2020-07-21: qty 20

## 2020-07-21 MED ORDER — LACTATED RINGERS IR SOLN
Status: DC | PRN
  Administered 2020-07-21: 1000 mL

## 2020-07-21 MED ORDER — DEXAMETHASONE SODIUM PHOSPHATE 4 MG/ML IJ SOLN: 4.0000 mg | INTRAMUSCULAR | Status: DC

## 2020-07-21 MED ORDER — GABAPENTIN 300 MG PO CAPS
300.0000 mg | ORAL_CAPSULE | ORAL | Status: AC
  Administered 2020-07-21: 300 mg via ORAL
  Filled 2020-07-21: qty 1

## 2020-07-21 MED ORDER — SUGAMMADEX SODIUM 200 MG/2ML IV SOLN
INTRAVENOUS | Status: DC | PRN
  Administered 2020-07-21: 400 mg via INTRAVENOUS

## 2020-07-21 MED ORDER — LIDOCAINE HCL 2 % IJ SOLN
INTRAMUSCULAR | Status: AC
  Filled 2020-07-21: qty 20

## 2020-07-21 MED ORDER — BUPIVACAINE HCL 0.25 % IJ SOLN
INTRAMUSCULAR | Status: DC | PRN
  Administered 2020-07-21: 27 mL
  Administered 2020-07-21: 20 mL

## 2020-07-21 MED ORDER — OXYCODONE HCL 5 MG PO TABS: 5.0000 mg | ORAL_TABLET | Freq: Once | ORAL | Status: DC | PRN

## 2020-07-21 MED ORDER — KETAMINE HCL 10 MG/ML IJ SOLN
INTRAMUSCULAR | Status: DC | PRN
  Administered 2020-07-21: 30 mg via INTRAVENOUS

## 2020-07-21 MED ORDER — DEXAMETHASONE SODIUM PHOSPHATE 10 MG/ML IJ SOLN
INTRAMUSCULAR | Status: AC
  Filled 2020-07-21: qty 1

## 2020-07-21 MED ORDER — DIPHENHYDRAMINE HCL 50 MG/ML IJ SOLN
INTRAMUSCULAR | Status: AC
  Filled 2020-07-21: qty 1

## 2020-07-21 MED ORDER — DEXAMETHASONE SODIUM PHOSPHATE 10 MG/ML IJ SOLN
INTRAMUSCULAR | Status: DC | PRN
  Administered 2020-07-21: 8 mg via INTRAVENOUS

## 2020-07-21 MED ORDER — PROPOFOL 10 MG/ML IV BOLUS
INTRAVENOUS | Status: DC | PRN
  Administered 2020-07-21: 200 mg via INTRAVENOUS

## 2020-07-21 SURGICAL SUPPLY — 74 items
ADH SKN CLS APL DERMABOND .7 (GAUZE/BANDAGES/DRESSINGS) ×2
AGENT HMST KT MTR STRL THRMB (HEMOSTASIS)
APL ESCP 34 STRL LF DISP (HEMOSTASIS)
APPLICATOR SURGIFLO ENDO (HEMOSTASIS) IMPLANT
BACTOSHIELD CHG 4% 4OZ (MISCELLANEOUS) ×1
BAG LAPAROSCOPIC 12 15 PORT 16 (BASKET) IMPLANT
BAG RETRIEVAL 12/15 (BASKET) ×3
BAG SPEC RTRVL LRG 6X4 10 (ENDOMECHANICALS) ×2
BLADE SURG SZ10 CARB STEEL (BLADE) ×1 IMPLANT
COVER BACK TABLE 60X90IN (DRAPES) ×3 IMPLANT
COVER TIP SHEARS 8 DVNC (MISCELLANEOUS) ×2 IMPLANT
COVER TIP SHEARS 8MM DA VINCI (MISCELLANEOUS) ×3
COVER WAND RF STERILE (DRAPES) IMPLANT
DECANTER SPIKE VIAL GLASS SM (MISCELLANEOUS) ×1 IMPLANT
DERMABOND ADVANCED (GAUZE/BANDAGES/DRESSINGS) ×1
DERMABOND ADVANCED .7 DNX12 (GAUZE/BANDAGES/DRESSINGS) ×2 IMPLANT
DRAPE ARM DVNC X/XI (DISPOSABLE) ×8 IMPLANT
DRAPE COLUMN DVNC XI (DISPOSABLE) ×2 IMPLANT
DRAPE DA VINCI XI ARM (DISPOSABLE) ×12
DRAPE DA VINCI XI COLUMN (DISPOSABLE) ×3
DRAPE SHEET LG 3/4 BI-LAMINATE (DRAPES) ×3 IMPLANT
DRAPE SURG IRRIG POUCH 19X23 (DRAPES) ×3 IMPLANT
DRSG OPSITE POSTOP 4X6 (GAUZE/BANDAGES/DRESSINGS) ×1 IMPLANT
DRSG OPSITE POSTOP 4X8 (GAUZE/BANDAGES/DRESSINGS) IMPLANT
ELECT PENCIL ROCKER SW 15FT (MISCELLANEOUS) ×1 IMPLANT
ELECT REM PT RETURN 15FT ADLT (MISCELLANEOUS) ×3 IMPLANT
GLOVE BIO SURGEON STRL SZ 6 (GLOVE) ×12 IMPLANT
GLOVE BIO SURGEON STRL SZ 6.5 (GLOVE) ×6 IMPLANT
GOWN STRL REUS W/ TWL LRG LVL3 (GOWN DISPOSABLE) ×8 IMPLANT
GOWN STRL REUS W/TWL LRG LVL3 (GOWN DISPOSABLE) ×12
HOLDER FOLEY CATH W/STRAP (MISCELLANEOUS) ×1 IMPLANT
IRRIG SUCT STRYKERFLOW 2 WTIP (MISCELLANEOUS) ×3
IRRIGATION SUCT STRKRFLW 2 WTP (MISCELLANEOUS) ×2 IMPLANT
KIT PROCEDURE DA VINCI SI (MISCELLANEOUS)
KIT PROCEDURE DVNC SI (MISCELLANEOUS) IMPLANT
KIT TURNOVER KIT A (KITS) IMPLANT
MANIPULATOR UTERINE 4.5 ZUMI (MISCELLANEOUS) ×3 IMPLANT
NDL SPNL 18GX3.5 QUINCKE PK (NEEDLE) IMPLANT
NEEDLE HYPO 22GX1.5 SAFETY (NEEDLE) ×3 IMPLANT
NEEDLE SPNL 18GX3.5 QUINCKE PK (NEEDLE) ×3 IMPLANT
OBTURATOR OPTICAL STANDARD 8MM (TROCAR) ×3
OBTURATOR OPTICAL STND 8 DVNC (TROCAR) ×2
OBTURATOR OPTICALSTD 8 DVNC (TROCAR) ×2 IMPLANT
PACK ROBOT GYN CUSTOM WL (TRAY / TRAY PROCEDURE) ×3 IMPLANT
PAD POSITIONING PINK XL (MISCELLANEOUS) ×3 IMPLANT
PORT ACCESS TROCAR AIRSEAL 12 (TROCAR) ×2 IMPLANT
PORT ACCESS TROCAR AIRSEAL 5M (TROCAR) ×1
POUCH SPECIMEN RETRIEVAL 10MM (ENDOMECHANICALS) ×1 IMPLANT
SCISSORS LAP 5X45 EPIX DISP (ENDOMECHANICALS) ×1 IMPLANT
SCRUB CHG 4% DYNA-HEX 4OZ (MISCELLANEOUS) ×2 IMPLANT
SEAL CANN UNIV 5-8 DVNC XI (MISCELLANEOUS) ×6 IMPLANT
SEAL XI 5MM-8MM UNIVERSAL (MISCELLANEOUS) ×12
SEALER TISSUE G2 CVD JAW 45CM (ENDOMECHANICALS) ×1 IMPLANT
SET TRI-LUMEN FLTR TB AIRSEAL (TUBING) ×3 IMPLANT
SLEEVE XCEL OPT CAN 5 100 (ENDOMECHANICALS) ×1 IMPLANT
SPONGE LAP 18X18 RF (DISPOSABLE) IMPLANT
SURGIFLO W/THROMBIN 8M KIT (HEMOSTASIS) IMPLANT
SUT MNCRL AB 4-0 PS2 18 (SUTURE) ×1 IMPLANT
SUT PDS AB 1 TP1 96 (SUTURE) IMPLANT
SUT VIC AB 0 CT1 27 (SUTURE) ×6
SUT VIC AB 0 CT1 27XBRD ANTBC (SUTURE) IMPLANT
SUT VIC AB 2-0 CT1 27 (SUTURE)
SUT VIC AB 2-0 CT1 TAPERPNT 27 (SUTURE) IMPLANT
SUT VIC AB 4-0 PS2 18 (SUTURE) ×7 IMPLANT
SYR 10ML LL (SYRINGE) IMPLANT
SYR 20ML LL LF (SYRINGE) ×2 IMPLANT
SYR 50ML LL SCALE MARK (SYRINGE) IMPLANT
TOWEL OR NON WOVEN STRL DISP B (DISPOSABLE) ×3 IMPLANT
TRAP SPECIMEN MUCUS 40CC (MISCELLANEOUS) IMPLANT
TRAY FOLEY MTR SLVR 16FR STAT (SET/KITS/TRAYS/PACK) ×3 IMPLANT
TROCAR XCEL NON-BLD 5MMX100MML (ENDOMECHANICALS) IMPLANT
UNDERPAD 30X36 HEAVY ABSORB (UNDERPADS AND DIAPERS) ×3 IMPLANT
WATER STERILE IRR 1000ML POUR (IV SOLUTION) ×3 IMPLANT
YANKAUER SUCT BULB TIP 10FT TU (MISCELLANEOUS) ×1 IMPLANT

## 2020-07-21 NOTE — Transfer of Care (Signed)
Immediate Anesthesia Transfer of Care Note  Patient: Katelyn Cohen  Procedure(s) Performed: XI ROBOTIC ASSISTED TOTAL HYSTERECTOMY WITH BILATERAL SALPINGO OOPHORECTOMY, MINI LAPARATOMY (Bilateral Abdomen) SENTINEL LYMPH  NODE BIOPSY (N/A Abdomen) LYSIS OF ADHESIONS (N/A Abdomen)  Patient Location: PACU  Anesthesia Type:General  Level of Consciousness: sedated  Airway & Oxygen Therapy: Patient Spontanous Breathing and Patient connected to face mask oxygen  Post-op Assessment: Report given to RN and Post -op Vital signs reviewed and stable  Post vital signs: Reviewed and stable  Last Vitals:  Vitals Value Taken Time  BP    Temp    Pulse 101 07/21/20 1104  Resp 19 07/21/20 1104  SpO2 100 % 07/21/20 1104  Vitals shown include unvalidated device data.  Last Pain:  Vitals:   07/21/20 0610  TempSrc: Oral         Complications: No complications documented.

## 2020-07-21 NOTE — Anesthesia Procedure Notes (Signed)
Procedure Name: Intubation Date/Time: 07/21/2020 7:43 AM Performed by: Raenette Rover, CRNA Pre-anesthesia Checklist: Patient identified, Emergency Drugs available, Suction available and Patient being monitored Patient Re-evaluated:Patient Re-evaluated prior to induction Oxygen Delivery Method: Circle system utilized Preoxygenation: Pre-oxygenation with 100% oxygen Induction Type: IV induction Ventilation: Mask ventilation without difficulty and Oral airway inserted - appropriate to patient size Laryngoscope Size: Mac and 3 Grade View: Grade I Tube type: Oral Tube size: 7.0 mm Number of attempts: 1 Airway Equipment and Method: Stylet Placement Confirmation: ETT inserted through vocal cords under direct vision,  positive ETCO2 and breath sounds checked- equal and bilateral Secured at: 21 cm Tube secured with: Tape Dental Injury: Teeth and Oropharynx as per pre-operative assessment

## 2020-07-21 NOTE — Discharge Instructions (Signed)
Return to work: 4 weeks (2 weeks with physical restrictions).  Activity: 1. Be up and out of the bed during the day.  Take a nap if needed.  You may walk up steps but be careful and use the hand rail.  Stair climbing will tire you more than you think, you may need to stop part way and rest.   2. No lifting or straining for 4 weeks.  3. No driving for 1 weeks.  Do Not drive if you are taking narcotic pain medicine.  4. Shower daily.  Use soap and water on your incision and pat dry; don't rub.   5. No sexual activity and nothing in the vagina for 8 weeks.  Medications:  - Take ibuprofen and tylenol first line for pain control. Take these regularly (every 6 hours) to decrease the build up of pain.  - If necessary, for severe pain not relieved by ibuprofen, contact Dr Serita Grit office and you will be prescribed percocet.  - While taking percocet you should take sennakot every night to reduce the likelihood of constipation. If this causes diarrhea, stop its use.  Diet: 1. Low sodium Heart Healthy Diet is recommended.  2. It is safe to use a laxative if you have difficulty moving your bowels.   Wound Care: 1. Keep clean and dry.  Shower daily.  Reasons to call the Doctor:   Fever - Oral temperature greater than 100.4 degrees Fahrenheit  Foul-smelling vaginal discharge  Difficulty urinating  Nausea and vomiting  Increased pain at the site of the incision that is unrelieved with pain medicine.  Difficulty breathing with or without chest pain  New calf pain especially if only on one side  Sudden, continuing increased vaginal bleeding with or without clots.   Follow-up: 1. See Everitt Amber in 4 weeks.  Contacts: For questions or concerns you should contact:  Dr. Everitt Amber at [phone] After hours and on week-ends call [phone] and ask to speak to the physician on call for Gynecologic Oncology  After Your Surgery  The information in this section will tell you what  to expect after your surgery, both during your stay and after you leave. You will learn how to safely recover from your surgery. Write down any questions you have and be sure to ask your doctor or nurse.  What to Expect When you wake up after your surgery, you will be in the [facility] (PACU) or your recovery room. A nurse will be monitoring your body temperature, blood pressure, pulse, and oxygen levels. You may have a urinary catheter in your bladder to help monitor the amount of urine you are making. It should come out before you go home. You will also have compression boots on your lower legs to help your circulation. Your pain medication will be given through an IV line or in tablet form. If you are having pain, tell your nurse. Your nurse will tell you how to recover from your surgery. Below are examples of ways you can help yourself recover safely. . You will be encouraged to walk with the help of your nurse or physical therapist. We will give you medication to relieve pain. Walking helps reduce the risk for blood clots and pneumonia. It also helps to stimulate your bowels so they begin working again. . Use your incentive spirometer. This will help your lungs expand, which prevents pneumonia.   Commonly Asked Questions  Will I have pain after surgery? Yes, you will have some  pain after your surgery, especially in the first few days. Your doctor and nurse will ask you about your pain often. You will be given medication to manage your pain as needed. If your pain is not relieved, please tell your doctor or nurse. It is important to control your pain so you can cough, breathe deeply, use your incentive spirometer, and get out of bed and walk.  Will I be able to eat? Yes, you will be able to eat a regular diet or eat as tolerated. You should start with foods that are soft and easy to digest such as apple sauce and chicken noodle soup. Eat small meals frequently, and then advance  to regular foods. If you experience bloating, gas, or cramps, limit high-fiber foods, including whole grain breads and cereal, nuts, seeds, salads, fresh fruit, broccoli, cabbage, and cauliflower. Will I have pain when I am home? The length of time each person has pain or discomfort varies. You may still have some pain when you go home and will probably be taking pain medication. Follow the guidelines below. . Take your medications as directed and as needed. . Call your doctor if the medication prescribed for you doesn't relieve your pain. . Don't drive or drink alcohol while you're taking prescription pain medication. . As your incision heals, you will have less pain and need less pain medication. A mild pain reliever such as acetaminophen (Tylenol) or ibuprofen (Advil) will relieve aches and discomfort. However, large quantities of acetaminophen may be harmful to your liver. Don't take more acetaminophen than the amount directed on the bottle or as instructed by your doctor or nurse. . Pain medication should help you as you resume your normal activities. Take enough medication to do your exercises comfortably. Pain medication is most effective 30 to 45 minutes after taking it. Marland Kitchen Keep track of when you take your pain medication. Taking it when your pain first begins is more effective than waiting for the pain to get worse. Pain medication may cause constipation (having fewer bowel movements than what is normal for you).  How can I prevent constipation? . Go to the bathroom at the same time every day. Your body will get used to going at that time. . If you feel the urge to go, don't put it off. Try to use the bathroom 5 to 15 minutes after meals. . After breakfast is a good time to move your bowels. The reflexes in your colon are strongest at this time. . Exercise, if you can. Walking is an excellent form of exercise. . Drink 8 (8-ounce) glasses (2 liters) of liquids daily, if you can. Drink  water, juices, soups, ice cream shakes, and other drinks that don't have caffeine. Drinks with caffeine, such as coffee and soda, pull fluid out of the body. . Slowly increase the fiber in your diet to 25 to 35 grams per day. Fruits, vegetables, whole grains, and cereals contain fiber. If you have an ostomy or have had recent bowel surgery, check with your doctor or nurse before making any changes in your diet. . Both over-the-counter and prescription medications are available to treat constipation. Start with 1 of the following over-the-counter medications first: o Docusate sodium (Colace) 100 mg. Take ___1__ capsules _2____ times a day. This is a stool softener that causes few side effects. Don't take it with mineral oil. o Polyethylene glycol (MiraLAX) 17 grams daily. o Senna (Senokot) 2 tablets at bedtime. This is a stimulant laxative, which can cause cramping. Marland Kitchen  If you haven't had a bowel movement in 2 days, call your doctor or nurse.  Can I shower? Yes, you should shower 24 hours after your surgery. Be sure to shower every day. Taking a warm shower is relaxing and can help decrease muscle aches. Use soap when you shower and gently wash your incision. Pat the areas dry with a towel after showering, and leave your incision uncovered (unless there is drainage). Call your doctor if you see any redness or drainage from your incision. Don't take tub baths until you discuss it with your doctor at the first appointment after your surgery. How do I care for my incisions? You will have several small incisions on your abdomen. The incisions are closed with Steri-Strips or Dermabond. You may also have square white dressings on your incisions (Primapore). You can remove these in the shower 24 hours after your surgery. You should clean your incisions with soap and water. If you go home with Steri-Strips on your incision, they will loosen and may fall off by themselves. If they haven't fallen off within 10  days, you can remove them. If you go home with Dermabond over your sutures (stitches), it will also loosen and peel off.  What are the most common symptoms after a hysterectomy? It's common for you to have some vaginal spotting or light bleeding. You should monitor this with a pad or a panty liner. If you have having heavy bleeding (bleeding through a pad or liner every 1 to 2 hours), call your doctor right away. It's also common to have some discomfort after surgery from the air that was pumped into your abdomen during surgery. To help with this, walk, drink plenty of liquids and make sure to take the stool softeners you received.  When is it safe for me to drive? You may resume driving 2 weeks after surgery, as long as you are not taking pain medication that may make you drowsy.  When can I resume sexual activity? Do not place anything in your vagina or have vaginal intercourse for 8 weeks after your surgery. Some people will need to wait longer than 8 weeks, so speak with your doctor before resuming sexual intercourse.  Will I be able to travel? Yes, you can travel. If you are traveling by plane within a few weeks after your surgery, make sure you get up and walk every hour. Be sure to stretch your legs, drink plenty of liquids, and keep your feet elevated when possible.  Will I need any supplies? Most people do not need any supplies after the surgery. In the rare case that you do need supplies, such as tubes or drains, your nurse will order them for you.  When can I return to work? The time it takes to return to work depends on the type of work you do, the type of surgery you had, and how fast your body heals. Most people can return to work about 2 to 4 weeks after the surgery.  What exercises can I do? Exercise will help you gain strength and feel better. Walking and stair climbing are excellent forms of exercise. Gradually increase the distance you walk. Climb stairs slowly, resting or  stopping as needed. Ask your doctor or nurse before starting more strenuous exercises.  When can I lift heavy objects? Most people should not lift anything heavier than 10 pounds (4.5 kilograms) for at least 4 weeks after surgery. Speak with your doctor about when you can do heavy lifting.  How can I cope with my feelings? After surgery for a serious illness, you may have new and upsetting feelings. Many people say they felt weepy, sad, worried, nervous, irritable, and angry at one time or another. You may find that you can't control some of these feelings. If this happens, it's a good idea to seek emotional support. The first step in coping is to talk about how you feel. Family and friends can help. Your nurse, doctor, and social worker can reassure, support, and guide you. It's always a good idea to let these professionals know how you, your family, and your friends are feeling emotionally. Many resources are available to patients and their families. Whether you're in the hospital or at home, the nurses, doctors, and social workers are here to help you and your family and friends handle the emotional aspects of your illness.  When is my first appointment after surgery? Your first appointment after surgery will be 2 to 4 weeks after surgery. Your nurse will give you instructions on how to make this appointment, including the phone number to call.  What if I have other questions? If you have any questions or concerns, please talk with your doctor or nurse. You can reach them Monday through Friday from 9:00 am to 5:00 pm. After 5:00 pm, during the weekend, and on holidays, call [phone] and ask for the doctor on call for your doctor.  . Have a temperature of 101 F (38.3 C) or higher . Have pain that does not get better with pain medication . Have redness, drainage, or swelling from your incisions

## 2020-07-21 NOTE — H&P (Signed)
Consult Note: Gyn-Onc  Consult was requested by Dr. Talbert Nan for the evaluation of Katelyn Cohen 54 y.o. female  CC:  No chief complaint on file.   Assessment/Plan:  Katelyn Cohen  is a 54 y.o.  year old P2 with grade 1 endometrial cancer (MMRd) in the setting of morbid obesity (BMI 48) and prior umbilical hernia repair with mesh.  A detailed discussion was held with the patient with regard to to her endometrial cancer diagnosis. We discussed the standard management options for uterine cancer which includes surgery followed possibly by adjuvant therapy depending on the results of surgery. The surgical management include a robotic assisted total hysterectomy and removal of the tubes and ovaries with sampling of lymph nodes. If a minimally invasive approach is not feasible, a laparotomy may be necessary (including for specimen delivery). The patient has been counseled about these surgical options and the risks of surgery in general including infection, bleeding, damage to surrounding structures (including bowel, bladder, ureters, nerves or vessels), and the postoperative risks of PE/ DVT, and lymphedema. I discussed that her morbid obesity and prior mesh placement put her at increased risk for complications from this surgery (especially risks of intolerance of positioning, nerve injury, and visceral injury ) and therefore I offered her medical management of her endometrial cancer with a progestin releasing IUD as a means to mitigate this risk.  I explained recent studies have demonstrated a 60 to 80% liklihood for elimination of endometrial cancer with a progestin releasing IUD.  After counseling and consideration of her options, she is in agreement to proceed with robotic assisted total hysterectomy >250gm with bilateral sapingo-oophorectomy and SLN biopsy, lysis of adhesions, possible mini-lap for specimen delivery.   She will may certainly require lysis of adhesions given her prior hernia repair  with mesh.  I explained the increased risk of bowel injury associated with this.  Given her large size uterus on ultrasound she may need a mini laparotomy for specimen delivery explained this to the patient.  She will be seen by anesthesia for preoperative clearance and discussion of postoperative pain management.  She was given the opportunity to ask questions, which were answered to her satisfaction, and she is agreement with the above mentioned plan of care.   We explained that robotic hysterectomy is typically an outpatient procedure with same day discharge provided that she is meeting appropriate discharge criteria from the PACU. We provided extensive counseling regarding post-operative expectations for recovery and restrictions/limitations. We provided information regarding multi-modal pain therapy and the importance of avoidance of opioids. I informed the patient that I anticipated a 2-4 week recovery time off of work.  We explained that after surgery we will review her definitive pathology and determine if adjuvant therapy is recommended.   She will require testing for hypermethylation of MLH1 to determine if she is a candidate for Lynch syndrome testing.   HPI: Katelyn Cohen is a 54 year old P 2 who was seen in consultation at the request of Dr Talbert Nan for evaluation and treatment of grade 1 endometrial cancer.   She reported lifelong symptoms of oligo menorrhagia.  This did not necessarily change however she had a particularly heavy menstrual period in October 2021 and a planned, scheduled GYN annual surveillance visit in November 2021 and mentioned this symptom at that time.  In the past her menstrual cycles had been approximately 3-4 times per year and very heavy.  This had not been treated with birth control pills or other ovulatory  regulation.  It had been assumed her menses were were caused by a diagnosis of fibroids.  She had never had endometrial sampling previously.  She saw Dr  Talbert Nan for a routine scheduled GYN visit on 05/22/20. Pap at that visit was normal with negative high-risk HPV. Work-up of symptoms included a transvaginal ultrasound scan and endometrial Pipelle. Transvaginal US on 06/11/2020 showed a uterus measuring 10.9 x 6.5 x 7.5 cm with an endometrial thickness of 2 cm.  The ovaries were not visualized due to body habitus. Endometrial sampling with office Pipelle was performed on 06/11/2020 and showed FIGO grade 1 endometrioid endometrial adenocarcinoma, MMRd (loss of expression of MLH1, and PMS2).   The patient's medical history is significant for extreme morbid obesity with a BMI of 48 kg per metered squared.  She denies a history of diabetes, but does have hypertension.  She has had 2 prior cesarean sections and an umbilical hernia repair with mesh performed laparoscopically in 2019.  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  Current Meds:  Outpatient Encounter Medications as of 06/19/2020  Medication Sig  . benzonatate (TESSALON) 100 MG capsule Take 100 mg by mouth 3 (three) times daily as needed for cough.   . diclofenac sodium (VOLTAREN) 1 % GEL Apply 2 g topically 4 (four) times daily as needed (pain).  Marland Kitchen loratadine (CLARITIN) 10 MG tablet Take 10 mg by mouth daily as needed for allergies.  Marland Kitchen losartan-hydrochlorothiazide (HYZAAR) 100-25 MG tablet Take 1 tablet by mouth daily.  . methocarbamol (ROBAXIN) 500 MG tablet Take 500 mg by mouth as needed for muscle spasms.  . naproxen (NAPROSYN) 500 MG tablet Take 500 mg by mouth daily.  . norethindrone (AYGESTIN) 5 MG tablet Take one tablet po BID until bleeding stops, then take one tablet a day for 10 days.  . ondansetron (ZOFRAN) 4 MG tablet Take by mouth every 8 (eight) hours as needed.   Marland Kitchen PROAIR HFA 108 (90 Base) MCG/ACT inhaler Inhale 1-2 puffs into the lungs every 6 (six) hours as needed for wheezing or shortness of breath.   . Semaglutide-Weight Management  (WEGOVY) 0.5 MG/0.5ML SOAJ Inject into the skin.  Marland Kitchen solifenacin (VESICARE) 5 MG tablet TAKE 1 TABLET (5 MG TOTAL) BY MOUTH DAILY. ONE BY MOUTH EVERY DAY  . traMADol (ULTRAM) 50 MG tablet TAKE 1 TO 2 TABLETS BY MOUTH EVERY 6 TO 8 HOURS AS NEEDED FOR PAIN  . medroxyPROGESTERone (PROVERA) 5 MG tablet Take one tablet daily for 5 days every month if no spontaneous cycle. (Patient not taking: Reported on 06/18/2020)   No facility-administered encounter medications on file as of 06/19/2020.    Allergy:  Allergies  Allergen Reactions  . Ace Inhibitors Cough  . Penicillins Itching and Swelling    PATIENT HAS HAD A PCN REACTION WITH IMMEDIATE RASH, FACIAL/TONGUE/THROAT SWELLING, SOB, OR LIGHTHEADEDNESS WITH HYPOTENSION:  NO  Has patient had a PCN reaction causing severe rash involving mucus membranes or skin necrosis: No Has patient had a PCN reaction that required hospitalization: No Patient has had a PCN reaction within the last 10 years.  No   . Hydrocodone Itching    Face     Social Hx:   Social History   Socioeconomic History  . Marital status: Married    Spouse name: Not on file  . Number of children: 2  . Years of education: Not on file  . Highest education level: Not on file  Occupational History  .  Occupation: admistrative  Tobacco Use  . Smoking status: Never Smoker  . Smokeless tobacco: Never Used  Vaping Use  . Vaping Use: Never used  Substance and Sexual Activity  . Alcohol use: No  . Drug use: No  . Sexual activity: Yes    Partners: Male    Birth control/protection: Surgical    Comment: Vasectomy  Other Topics Concern  . Not on file  Social History Narrative  . Not on file   Social Determinants of Health   Financial Resource Strain: Not on file  Food Insecurity: Not on file  Transportation Needs: Not on file  Physical Activity: Not on file  Stress: Not on file  Social Connections: Not on file  Intimate Partner Violence: Not on file    Past Surgical  Hx:  Past Surgical History:  Procedure Laterality Date  . CESAREAN SECTION    . INSERTION OF MESH N/A 12/06/2017   Procedure: INSERTION OF MESH;  Surgeon: Ralene Ok, MD;  Location: [facility];  Service: General;  Laterality: N/A;  . LITHOTRIPSY Right 1998  . REPEAT CESAREAN SECTION    . UMBILICAL HERNIA REPAIR N/A 12/06/2017   Procedure: LAPAROSCOPIC UMBILICAL HERNIA;  Surgeon: Ralene Ok, MD;  Location: [facility];  Service: General;  Laterality: N/A;    Past Medical Hx:  Past Medical History:  Diagnosis Date  . Arthritis   . Asthma    seasonal   . Bladder incontinence    cant hold urine  . Claustrophobia   . Complication of anesthesia    facial itching,"hot spot"  . GERD (gastroesophageal reflux disease)   . History of kidney stones   . Hypertension   . Lyme disease 5/14  . Menorrhagia with irregular cycle   . Sleep apnea    cpap  . Spinal stenosis     Past Gynecological History:  See HPI Patient's last menstrual period was 07/10/2020.  Family Hx:  Family History  Problem Relation Age of Onset  . Liver disease Mother        nonalcoholic fatty liver disease  . Hepatitis C Mother   . Hypertension Mother   . Hyperthyroidism Mother   . Cancer Father        lung cancer  . Hypothyroidism Father   . ALS Sister   . Mental illness Sister   . Breast cancer Maternal Grandmother     Review of Systems:  Constitutional  Feels well,    ENT Normal appearing ears and nares bilaterally Skin/Breast  No rash, sores, jaundice, itching, dryness Cardiovascular  No chest pain, shortness of breath, or edema  Pulmonary  No cough or wheeze.  Gastro Intestinal  No nausea, vomitting, or diarrhoea. No bright red blood per rectum, no abdominal pain, change in bowel movement, or constipation.  Genito Urinary  No frequency, urgency, dysuria, + postmenopausal bleeding Musculo Skeletal  No myalgia, arthralgia, joint swelling or pain  Neurologic  No weakness, numbness, change in  gait,  Psychology  No depression, anxiety, insomnia.   Vitals:  Blood pressure 140/89, pulse 94, temperature 98.1 F (36.7 C), temperature source Oral, resp. rate 16, last menstrual period 07/10/2020, SpO2 100 %.  Physical Exam: WD in NAD Neck  Supple NROM, without any enlargements.  Lymph Node Survey No cervical supraclavicular or inguinal adenopathy Cardiovascular  Pulse normal rate, regularity and rhythm. S1 and S2 normal.  Lungs  Clear to auscultation bilateraly, without wheezes/crackles/rhonchi. Good air movement.  Skin  No rash/lesions/breakdown  Psychiatry  Alert and oriented  to person, place, and time  Abdomen  Normoactive bowel sounds, abdomen soft, non-tender and obese without evidence of hernia. No pannus. Low transverse incision. Back No CVA tenderness Genito Urinary  Vulva/vagina: Normal external female genitalia.  No lesions. No discharge or bleeding.  Bladder/urethra:  No lesions or masses, well supported bladder  Vagina: normal, long  Cervix: small, unable to visualize, palpably normal.  Uterus: unable to discern size due to body habitus. no parametrial involvement or nodularity.  Adnexa: no palpable masses. Rectal  deferred Extremities  No bilateral cyanosis, clubbing or edema.   Thereasa Solo, MD  07/21/2020, 7:20 AM

## 2020-07-21 NOTE — Op Note (Signed)
OPERATIVE NOTE 07/21/20  Surgeon: Donaciano Eva   Assistants: Dr [name] (an MD assistant was necessary for tissue manipulation, management of robotic instrumentation, retraction and positioning due to the complexity of the case and hospital policies).   Anesthesia: General endotracheal anesthesia  ASA Class: 3   Pre-operative Diagnosis: endometrial cancer grade 1  Post-operative Diagnosis: same, morbidly obese, abdominal and pelvic adhesions, enlarged fibroid uterus  Operation: Robotic-assisted laparoscopic total hysterectomy >250gm with bilateral salpingoophorectomy, bilateral SLN biopsy, lysis of adhesions, mini laparotomy.   Surgeon: Donaciano Eva  Assistant Surgeon: [name] MD  Anesthesia: GET  Urine Output: 300cc  Operative Findings:  : Morbid obesity including dense intraperitoneal and retroperitoneal adiposity (BMI 49) which made visualization of visceral limited and increased the number of ports and equipment necessary and increased the expertise necessary to complete the procedure.  Fibroid 15cm uterus which was densely adherent to the anterior abdominal wall due to cesarean section adhesions. Obliterated anterior cul de sac. There was no gross extrauterine disease.  Dense omental adhesions to anterior abdominal wall and mesh.   Estimated Blood Loss:  75cc      Total IV Fluids: 800 ml         Specimens: uterus, cervix, bilateral tubes and ovaries, right and left sentinel lymph nodes, omentum          Complications:  None; patient tolerated the procedure well.         Disposition: PACU - hemodynamically stable.  Procedure Details  The patient was seen in the Holding Room. The risks, benefits, complications, treatment options, and expected outcomes were discussed with the patient.  The patient concurred with the proposed plan, giving informed consent.  The site of surgery properly noted/marked. The patient was identified as Katelyn Cohen and the procedure verified as a Robotic-assisted hysterectomy >250gm with bilateral salpingo oophorectomy with SLN biopsy. A Time Out was held and the above information confirmed.  After induction of anesthesia, the patient was draped and prepped in the usual sterile manner.  Additional OR personnel were necessary for positioning of the patient due to her morbid obesity.  Additional time was necessary for the procedure due to her morbid obesity.  Pt was placed in supine position after anesthesia and draped and prepped in the usual sterile manner. The abdominal drape was placed after the CholoraPrep had been allowed to dry for 3 minutes.  Her arms were tucked to her side with all appropriate precautions.  The shoulders were stabilized with padded shoulder blocks applied to the acromium processes.  The patient was placed in the semi-lithotomy position in [place].  The perineum was prepped with Betadine. The patient was then prepped. Foley catheter was placed.  A sterile speculum was placed in the vagina.  The cervix was grasped with a single-tooth tenaculum. 2mg  total of ICG was injected into the cervical stroma at 2 and 9 o'clock with 1cc injected at a 1cm and 37mm depth (concentration 0.5mg /ml) in all locations. The cervix was dilated with Kennon Rounds dilators.  The ZUMI uterine manipulator with a medium colpotomizer ring was placed without difficulty.  A pneum occluder balloon was placed over the manipulator.  OG tube placement was confirmed and to suction.   Next, a 5 mm skin incision was made 1 cm below the subcostal margin in the midclavicular line.  The 5 mm Optiview port and scope was used for direct entry.  Opening pressure was under 10 mm CO2.  The abdomen was insufflated and  the findings were noted as above.   At this point and all points during the procedure, the patient's intra-abdominal pressure did not exceed 15 mmHg.  An additional 5 mm port was placed in the left upper quadrant region that  was free of adhesions.  This was used for laparoscopic adhesiolysis.  The left most lateral mid abdominal 8 mm robotic port was placed under direct visualization as this portion of the abdomen was free of adhesive disease.  For approximately 30 minutes sharp adhesiolysis was performed with the laparoscopic EndoShears and bipolar sealing with the Enseal device.  The omentum was carefully taken down from its dense attachments to the ventral abdominal wall mesh.  When the omentum had been taken down it was inspected for hemostasis which was noted.  The bowel was free of adhesions to the anterior abdominal wall.  A segment of omentum was necessarily resected and placed in Endo Catch bag and retrieved for pathology due to its devitalization.  All additional ports were placed under direct visualization.  This included an 8 mm port in the umbilicus and the left and right mid abdominal 8 mm port.  The patient was placed in steep Trendelenburg.  Bowel was folded away into the upper abdomen.  The robot was docked in the normal manner.  For approximately 20 minutes monopolar adhesiolysis was performed to separate the uterus from the anterior abdominal wall and the obliterated cul-de-sac.  This somewhat normalize pelvic anatomy.  The right and left peritoneum were opened parallel to the IP ligament to open the retroperitoneal spaces bilaterally. The SLN mapping was performed in bilateral pelvic basins. The para rectal and paravesical spaces were opened up entirely with careful dissection below the level of the ureters bilaterally and to the depth of the uterine artery origin in order to skeletonize the uterine "web" and ensure visualization of all parametrial channels. The para-aortic basins were carefully exposed and evaluated for isolated para-aortic SLN's. Lymphatic channels were identified travelling to the following visualized sentinel lymph node's: Right and left obturator sentinel lymph nodes. These SLN's were  separated from their surrounding lymphatic tissue, removed and sent for permanent pathology.  The hysterectomy was started after the round ligament on the right side was incised and the retroperitoneum was entered and the pararectal space was developed.  The ureter was noted to be on the medial leaf of the broad ligament.  The peritoneum above the ureter was incised and stretched and the infundibulopelvic ligament was skeletonized, cauterized and cut.  The posterior peritoneum was taken down to the level of the KOH ring.  The anterior peritoneum was also taken down.  The bladder flap was created to the level of the KOH ring.  The uterine artery on the right side was skeletonized, cauterized and cut in the normal manner.  A similar procedure was performed on the left.  The colpotomy was made and the uterus, cervix, bilateral ovaries and tubes were amputated but could not be delivered through the vagina due to their size.  The uterus was placed in a 15 cm Endo Catch bag for later retrieval via a mini laparotomy incision.  Pedicles were inspected and excellent hemostasis was achieved.    The colpotomy at the vaginal cuff was closed with Vicryl on a CT1 needle in a running manner.  Irrigation was used and excellent hemostasis was achieved.  At this point in the procedure was completed.  Robotic instruments were removed under direct visulaization.  The robot was undocked.   A  6cm suprapubic low transverse incision was made with the scalpel. The subcutaneous skin was opened with the bovie. The fascia was opened with the bovie transversely and the rectus muscles were dissected off of the fascia inferiorally and superiorally. The peritoneum was opened sharply in the midline. The peritoneal incision was extended. The uterine specimen in the endocatch bag was retrieved through the incision. The fascia was closed with running 0-vicryl. The subcutaneous fat was closed with 2-0 vicryl. 20cc of exparel mixed with 20cc of  marcaine was infiltrated into the incision. The incision was closed at the skin with monocryl and dermabond.   The 10 mm ports were closed with Vicryl on a UR-5 needle and the fascia was closed with 0 Vicryl on a UR-5 needle.  The skin was closed with 4-0 Vicryl in a subcuticular manner.  Dermabond was applied.  Sponge, lap and needle counts correct x 2.  The patient was taken to the recovery room in stable condition.  The vagina was swabbed with  minimal bleeding noted.   All instrument and needle counts were correct x  3.   The patient was transferred to the recovery room in a stable condition.  The duration of the procedure was increased by 100% due to multiple complicating factors including morbid obesity with a BMI of 49, dense intraperitoneal adhesions, and an enlarged uterus requiring mini laparotomy for specimen delivery.  Therefore the procedure length was more than 3 hours metatypical procedural duration is 90 minutes.  This represented high complexity procedure.  Donaciano Eva, MD

## 2020-07-22 ENCOUNTER — Telehealth: Payer: Self-pay

## 2020-07-22 ENCOUNTER — Encounter (HOSPITAL_COMMUNITY): Payer: Self-pay | Admitting: Gynecologic Oncology

## 2020-07-22 NOTE — Telephone Encounter (Signed)
Katelyn Cohen  states that she is eating,drinking, and urinating well. Passing gas. Will increase the senokot-s to 2 tablets bid with 8 oz of water.  If no BM by Friday 07-24-20 she will add a capful of Miralax bid. Afebrile. Incisions D&I The honeycomb dressing can be removed on 12-26 or 07-27-20. She does not need to reapply a dressing.  Pt aware of post op appointments as well as the office number [phone] and after hours number [phone] to call if she has any questions or concerns

## 2020-07-23 LAB — SURGICAL PATHOLOGY

## 2020-07-27 NOTE — Anesthesia Postprocedure Evaluation (Signed)
Anesthesia Post Note  Patient: Katelyn Cohen  Procedure(s) Performed: XI ROBOTIC ASSISTED TOTAL HYSTERECTOMY WITH BILATERAL SALPINGO OOPHORECTOMY, MINI LAPARATOMY (Bilateral Abdomen) SENTINEL LYMPH  NODE BIOPSY (N/A Abdomen) LYSIS OF ADHESIONS (N/A Abdomen)     Patient location during evaluation: PACU Anesthesia Type: General Level of consciousness: awake and alert Pain management: pain level controlled Vital Signs Assessment: post-procedure vital signs reviewed and stable Respiratory status: spontaneous breathing, nonlabored ventilation, respiratory function stable and patient connected to nasal cannula oxygen Cardiovascular status: blood pressure returned to baseline and stable Postop Assessment: no apparent nausea or vomiting Anesthetic complications: no   No complications documented.  Last Vitals:  Vitals:   07/21/20 1230 07/21/20 1250  BP: 131/88 137/88  Pulse: 91 96  Resp: 12 16  Temp: 36.6 C 36.8 C  SpO2: 93% 97%    Last Pain:  Vitals:   07/21/20 1250  TempSrc: Oral  PainSc: 4                  Carlyon Nolasco

## 2020-07-28 ENCOUNTER — Inpatient Hospital Stay: Payer: BC Managed Care – PPO | Attending: Gynecologic Oncology | Admitting: Gynecologic Oncology

## 2020-07-28 ENCOUNTER — Encounter: Payer: Self-pay | Admitting: Gynecologic Oncology

## 2020-07-28 DIAGNOSIS — C541 Malignant neoplasm of endometrium: Secondary | ICD-10-CM

## 2020-07-28 DIAGNOSIS — Z90710 Acquired absence of both cervix and uterus: Secondary | ICD-10-CM

## 2020-07-28 DIAGNOSIS — Z7189 Other specified counseling: Secondary | ICD-10-CM

## 2020-07-28 DIAGNOSIS — Z90722 Acquired absence of ovaries, bilateral: Secondary | ICD-10-CM

## 2020-07-28 NOTE — Progress Notes (Signed)
Gynecologic Oncology Telehealth Follow-up Note  I connected with Katelyn Cohen on 07/28/20 at  3:30 PM EST by telephone and verified that I am speaking with the correct person using two identifiers.  I discussed the limitations, risks, security and privacy concerns of performing an evaluation and management service by telemedicine and the availability of in-person appointments. I also discussed with the patient that there may be a patient responsible charge related to this service. The patient expressed understanding and agreed to proceed.  Other persons participating in the visit and their role in the encounter: husband.  Patient's location: home Provider's location: [place]  Chief Complaint:  Chief Complaint  Patient presents with  . endometrial cancer   Assessment/Plan:  Katelyn Cohen  is a 54 y.o.  year old P2 with stage IA grade 1 endometrial cancer (MMRd) s/p robotic hysterectomy and staging on 07/21/20.  Pathology revealed low risk factors for recurrence, therefore no adjuvant therapy is recommended according to NCCN guidelines.  I discussed risk for recurrence and typical symptoms encouraged her to notify us of these should they develop between visits.  I recommend she have follow-up every 6 months for 5 years in accordance with NCCN guidelines. Those visits should include symptom assessment, physical exam and pelvic examination. Pap smears are not indicated or recommended in the routine surveillance of endometrial cancer.  She will require testing for hypermethylation of MLH1 to determine if she is a candidate for Lynch syndrome testing.   HPI: Katelyn Cohen is a 54 year old P 2 who was seen in consultation at the request of Dr Talbert Nan for evaluation and treatment of grade 1 endometrial cancer.   She reported lifelong symptoms of oligo menorrhagia.  This did not necessarily change however she had a particularly heavy menstrual period in October 2021  and a planned, scheduled GYN annual surveillance visit in November 2021 and mentioned this symptom at that time.  In the past her menstrual cycles had been approximately 3-4 times per year and very heavy.  This had not been treated with birth control pills or other ovulatory regulation.  It had been assumed her menses were were caused by a diagnosis of fibroids.  She had never had endometrial sampling previously.  She saw Dr Talbert Nan for a routine scheduled GYN visit on 05/22/20. Pap at that visit was normal with negative high-risk HPV. Work-up of symptoms included a transvaginal ultrasound scan and endometrial Pipelle. Transvaginal US on 06/11/2020 showed a uterus measuring 10.9 x 6.5 x 7.5 cm with an endometrial thickness of 2 cm.  The ovaries were not visualized due to body habitus. Endometrial sampling with office Pipelle was performed on 06/11/2020 and showed FIGO grade 1 endometrioid endometrial adenocarcinoma, MMRd (loss of expression of MLH1, and PMS2).   Interval Hx:  On 07/21/20 she underwent robotic assisted total hysterectomy, BSO, SLN biopsy (bilateral). Intraoperative findings were significant for morbid obesity including dense intraperitoneal and retroperitoneal adiposity (BMI 49) which made visualization of visceral limited. A fibroid 15cm uterus which was densely adherent to the anterior abdominal wall due to cesarean section adhesions. Obliterated anterior cul de sac. There was no gross extrauterine disease. Dense omental adhesions to anterior abdominal wall and mesh. Surgery was challenging due to obesity and adhesions, but uncomplicated.  Final pathology revealed a FIGO grade 1 endometrioid endometrial adenocarcinoma with 2 of 51m (inner half) myometrial invasion. There was no LVSI, lymph node involvement, cervical or ovarian involvement.  She was determined to have a low  risk cancer, stage IA grade 1 and no adjuvant therapy was recommended in accordance with NCCN guidelines.  Since  surgery she has done well with no complaints.   Current Meds:  Outpatient Encounter Medications as of 07/28/2020  Medication Sig  . acetaminophen (TYLENOL) 500 MG tablet Take 1,000 mg by mouth every 6 (six) hours as needed for moderate pain.  . cetirizine (ZYRTEC) 10 MG tablet Take 5 mg by mouth daily as needed for allergies.  . cholecalciferol (VITAMIN D3) 25 MCG (1000 UNIT) tablet Take 1,000 Units by mouth daily.  . diclofenac sodium (VOLTAREN) 1 % GEL Apply 2 g topically 4 (four) times daily as needed (pain).  Marland Kitchen ibuprofen (ADVIL) 200 MG tablet Take 400 mg by mouth every 6 (six) hours as needed.  Marland Kitchen ibuprofen (ADVIL) 800 MG tablet Take 1 tablet (800 mg total) by mouth every 8 (eight) hours as needed for moderate pain. For AFTER surgery  . losartan-hydrochlorothiazide (HYZAAR) 100-25 MG tablet Take 1 tablet by mouth daily.  . medroxyPROGESTERone (PROVERA) 5 MG tablet Take one tablet daily for 5 days every month if no spontaneous cycle.  . naproxen (NAPROSYN) 500 MG tablet Take 500 mg by mouth daily.  . ondansetron (ZOFRAN) 4 MG tablet Take 4 mg by mouth every 8 (eight) hours as needed for nausea or vomiting.   Marland Kitchen PROAIR HFA 108 (90 Base) MCG/ACT inhaler Inhale 1-2 puffs into the lungs every 6 (six) hours as needed for wheezing or shortness of breath.   . Semaglutide-Weight Management (WEGOVY) 0.5 MG/0.5ML SOAJ Inject 0.5 mg into the skin once a week.   . senna-docusate (SENOKOT-S) 8.6-50 MG tablet Take 2 tablets by mouth at bedtime. For AFTER surgery, do not take if having diarrhea  . solifenacin (VESICARE) 5 MG tablet TAKE 1 TABLET (5 MG TOTAL) BY MOUTH DAILY. ONE BY MOUTH EVERY DAY (Patient taking differently: Take 5 mg by mouth daily. TAKE 1 TABLET (5 MG TOTAL) BY MOUTH DAILY. ONE BY MOUTH EVERY DAY)  . traMADol (ULTRAM) 50 MG tablet Take 50-100 mg by mouth every 6 (six) hours as needed for moderate pain.   . traMADol (ULTRAM) 50 MG tablet Take 1 tablet (50 mg total) by mouth every 6 (six)  hours as needed for severe pain. For AFTER surgery, do not take and drive   No facility-administered encounter medications on file as of 07/28/2020.    Allergy:  Allergies  Allergen Reactions  . Ace Inhibitors Cough  . Penicillins Itching and Swelling    PATIENT HAS HAD A PCN REACTION WITH IMMEDIATE RASH, FACIAL/TONGUE/THROAT SWELLING, SOB, OR LIGHTHEADEDNESS WITH HYPOTENSION:  NO  Has patient had a PCN reaction causing severe rash involving mucus membranes or skin necrosis: No Has patient had a PCN reaction that required hospitalization: No Patient has had a PCN reaction within the last 10 years.  No   . Codeine Other (See Comments)  . Hydrocodone Itching    Face     Social Hx:   Social History   Socioeconomic History  . Marital status: Married    Spouse name: Not on file  . Number of children: 2  . Years of education: Not on file  . Highest education level: Not on file  Occupational History  . Occupation: admistrative  Tobacco Use  . Smoking status: Never Smoker  . Smokeless tobacco: Never Used  Vaping Use  . Vaping Use: Never used  Substance and Sexual Activity  . Alcohol use: No  . Drug use: No  .  Sexual activity: Yes    Partners: Male    Birth control/protection: Surgical    Comment: Vasectomy  Other Topics Concern  . Not on file  Social History Narrative  . Not on file   Social Determinants of Health   Financial Resource Strain: Not on file  Food Insecurity: Not on file  Transportation Needs: Not on file  Physical Activity: Not on file  Stress: Not on file  Social Connections: Not on file  Intimate Partner Violence: Not on file    Past Surgical Hx:  Past Surgical History:  Procedure Laterality Date  . CESAREAN SECTION    . INSERTION OF MESH N/A 12/06/2017   Procedure: INSERTION OF MESH;  Surgeon: Ralene Ok, MD;  Location: [facility];  Service: General;  Laterality: N/A;  . LITHOTRIPSY Right 1998  . LYSIS OF ADHESION N/A 07/21/2020   Procedure:  LYSIS OF ADHESIONS;  Surgeon: Everitt Amber, MD;  Location: [facility];  Service: Gynecology;  Laterality: N/A;  . REPEAT CESAREAN SECTION    . ROBOTIC ASSISTED TOTAL HYSTERECTOMY WITH BILATERAL SALPINGO OOPHERECTOMY Bilateral 07/21/2020   Procedure: XI ROBOTIC ASSISTED TOTAL HYSTERECTOMY WITH BILATERAL SALPINGO OOPHORECTOMY, MINI LAPARATOMY;  Surgeon: Everitt Amber, MD;  Location: [facility];  Service: Gynecology;  Laterality: Bilateral;  GREATER THAN 250 GRAMS  . SENTINEL NODE BIOPSY N/A 07/21/2020   Procedure: SENTINEL LYMPH  NODE BIOPSY;  Surgeon: Everitt Amber, MD;  Location: [facility];  Service: Gynecology;  Laterality: N/A;  . UMBILICAL HERNIA REPAIR N/A 12/06/2017   Procedure: LAPAROSCOPIC UMBILICAL HERNIA;  Surgeon: Ralene Ok, MD;  Location: [facility];  Service: General;  Laterality: N/A;    Past Medical Hx:  Past Medical History:  Diagnosis Date  . Arthritis   . Asthma    seasonal   . Bladder incontinence    cant hold urine  . Claustrophobia   . Complication of anesthesia    facial itching,"hot spot"  . GERD (gastroesophageal reflux disease)   . History of kidney stones   . Hypertension   . Lyme disease 5/14  . Menorrhagia with irregular cycle   . Sleep apnea    cpap  . Spinal stenosis     Past Gynecological History:  See HPI Patient's last menstrual period was 07/10/2020.  Family Hx:  Family History  Problem Relation Age of Onset  . Liver disease Mother        nonalcoholic fatty liver disease  . Hepatitis C Mother   . Hypertension Mother   . Hyperthyroidism Mother   . Cancer Father        lung cancer  . Hypothyroidism Father   . ALS Sister   . Mental illness Sister   . Breast cancer Maternal Grandmother     Review of Systems:  Constitutional  Feels well,    ENT Normal appearing ears and nares bilaterally Skin/Breast  No rash, sores, jaundice, itching, dryness Cardiovascular  No chest pain, shortness of breath, or edema  Pulmonary  No cough or wheeze.  Gastro  Intestinal  No nausea, vomitting, or diarrhoea. No bright red blood per rectum, no abdominal pain, change in bowel movement, or constipation.  Genito Urinary  No frequency, urgency, dysuria, no bleeding Musculo Skeletal  No myalgia, arthralgia, joint swelling or pain  Neurologic  No weakness, numbness, change in gait,  Psychology  No depression, anxiety, insomnia.   Vitals:  Last menstrual period 07/10/2020.  Physical Exam: Deferred  I discussed the assessment and treatment plan with the patient. The patient  was provided with an opportunity to ask questions and all were answered. The patient agreed with the plan and demonstrated an understanding of the instructions.   The patient was advised to call back or see an in-person evaluation if the symptoms worsen or if the condition fails to improve as anticipated.   I provided 20 minutes of non face-to-face telephone visit time during this encounter, and > 50% was spent counseling as documented under my assessment & plan.  Thereasa Solo, MD  07/28/2020, 3:58 PM

## 2020-08-05 NOTE — Progress Notes (Signed)
Gynecologic Oncology Follow-up Note  Chief Complaint:  Chief Complaint  Patient presents with  . Endometrial cancer ([place])  . Counseling and coordination of care   Assessment/Plan:  [name]  is a 55 y.o.  year old P2 with stage IA grade 1 endometrial cancer (MMRd) s/p robotic hysterectomy and staging on 07/21/20.  Pathology revealed low risk factors for recurrence, therefore no adjuvant therapy is recommended according to NCCN guidelines.  I discussed risk for recurrence and typical symptoms encouraged her to notify us of these should they develop between visits.  I recommend she have follow-up every 6 months for 5 years in accordance with NCCN guidelines. Those visits should include symptom assessment, physical exam and pelvic examination. Pap smears are not indicated or recommended in the routine surveillance of endometrial cancer.  Will check UA to assess for occult infection.   HPI: [name] is a 55 year old P 2 who was seen in consultation at the request of Dr Katelyn Cohen for evaluation and treatment of grade 1 endometrial cancer.   She reported lifelong symptoms of oligo menorrhagia.  This did not necessarily change however she had a particularly heavy menstrual period in October 2021 and a planned, scheduled GYN annual surveillance visit in November 2021 and mentioned this symptom at that time.  In the past her menstrual cycles had been approximately 3-4 times per year and very heavy.  This had not been treated with birth control pills or other ovulatory regulation.  It had been assumed her menses were were caused by a diagnosis of fibroids.  She had never had endometrial sampling previously.  She saw Dr Katelyn Cohen for a routine scheduled GYN visit on 05/22/20. Pap at that visit was normal with negative high-risk HPV. Work-up of symptoms included a transvaginal ultrasound scan and endometrial Pipelle. Transvaginal US on 06/11/2020 showed a uterus measuring 10.9 x 6.5 x 7.5  cm with an endometrial thickness of 2 cm.  The ovaries were not visualized due to body habitus. Endometrial sampling with office Pipelle was performed on 06/11/2020 and showed FIGO grade 1 endometrioid endometrial adenocarcinoma, MMRd (loss of expression of MLH1, and PMS2).   Interval Hx:  On 07/21/20 she underwent robotic assisted total hysterectomy, BSO, SLN biopsy (bilateral). Intraoperative findings were significant for morbid obesity including dense intraperitoneal and retroperitoneal adiposity (BMI 49) which made visualization of visceral limited. A fibroid 15cm uterus which was densely adherent to the anterior abdominal wall due to cesarean section adhesions. Obliterated anterior cul de sac. There was no gross extrauterine disease. Dense omental adhesions to anterior abdominal wall and mesh. Surgery was challenging due to obesity and adhesions, but uncomplicated.  Final pathology revealed a FIGO grade 1 endometrioid endometrial adenocarcinoma with 2 of 46m (inner half) myometrial invasion. There was no LVSI, lymph node involvement, cervical or ovarian involvement. MWacowith hypermethylation of MLH1 present therefore Lynch Syndrome testing not ordered.  She was determined to have a low risk cancer, stage IA grade 1 and no adjuvant therapy was recommended in accordance with NCCN guidelines.  Since surgery she has done well with complaints of dysuria and frequency.    Current Meds:  Outpatient Encounter Medications as of 08/12/2020  Medication Sig  . acetaminophen (TYLENOL) 500 MG tablet Take 1,000 mg by mouth every 6 (six) hours as needed for moderate pain.  . cetirizine (ZYRTEC) 10 MG tablet Take 5 mg by mouth daily as needed for allergies.  . cholecalciferol (VITAMIN D3) 25 MCG (1000 UNIT) tablet Take 1,000  Units by mouth daily.  . diclofenac sodium (VOLTAREN) 1 % GEL Apply 2 g topically 4 (four) times daily as needed (pain).  Marland Kitchen ibuprofen (ADVIL) 200 MG tablet Take 400 mg by mouth every 6  (six) hours as needed.  . ondansetron (ZOFRAN) 4 MG tablet Take 4 mg by mouth every 8 (eight) hours as needed for nausea or vomiting.   . Semaglutide-Weight Management (WEGOVY) 0.5 MG/0.5ML SOAJ Inject 0.5 mg into the skin once a week.   . solifenacin (VESICARE) 5 MG tablet TAKE 1 TABLET (5 MG TOTAL) BY MOUTH DAILY. ONE BY MOUTH EVERY DAY (Patient taking differently: Take 5 mg by mouth daily. TAKE 1 TABLET (5 MG TOTAL) BY MOUTH DAILY. ONE BY MOUTH EVERY DAY)  . losartan-hydrochlorothiazide (HYZAAR) 100-25 MG tablet Take 1 tablet by mouth daily.  . naproxen (NAPROSYN) 500 MG tablet Take 500 mg by mouth daily. (Patient not taking: Reported on 08/11/2020)  . PROAIR HFA 108 (90 Base) MCG/ACT inhaler Inhale 1-2 puffs into the lungs every 6 (six) hours as needed for wheezing or shortness of breath.  (Patient not taking: Reported on 08/11/2020)  . [DISCONTINUED] ibuprofen (ADVIL) 800 MG tablet Take 1 tablet (800 mg total) by mouth every 8 (eight) hours as needed for moderate pain. For AFTER surgery  . [DISCONTINUED] medroxyPROGESTERone (PROVERA) 5 MG tablet Take one tablet daily for 5 days every month if no spontaneous cycle.  . [DISCONTINUED] senna-docusate (SENOKOT-S) 8.6-50 MG tablet Take 2 tablets by mouth at bedtime. For AFTER surgery, do not take if having diarrhea  . [DISCONTINUED] traMADol (ULTRAM) 50 MG tablet Take 50-100 mg by mouth every 6 (six) hours as needed for moderate pain.  (Patient not taking: Reported on 08/11/2020)  . [DISCONTINUED] traMADol (ULTRAM) 50 MG tablet Take 1 tablet (50 mg total) by mouth every 6 (six) hours as needed for severe pain. For AFTER surgery, do not take and drive   No facility-administered encounter medications on file as of 08/12/2020.    Allergy:  Allergies  Allergen Reactions  . Ace Inhibitors Cough  . Penicillins Itching and Swelling    PATIENT HAS HAD A PCN REACTION WITH IMMEDIATE RASH, FACIAL/TONGUE/THROAT SWELLING, SOB, OR LIGHTHEADEDNESS WITH HYPOTENSION:   NO  Has patient had a PCN reaction causing severe rash involving mucus membranes or skin necrosis: No Has patient had a PCN reaction that required hospitalization: No Patient has had a PCN reaction within the last 10 years.  No   . Codeine Other (See Comments)  . Hydrocodone Itching    Face     Social Hx:   Social History   Socioeconomic History  . Marital status: Married    Spouse name: Not on file  . Number of children: 2  . Years of education: Not on file  . Highest education level: Not on file  Occupational History  . Occupation: admistrative  Tobacco Use  . Smoking status: Never Smoker  . Smokeless tobacco: Never Used  Vaping Use  . Vaping Use: Never used  Substance and Sexual Activity  . Alcohol use: No  . Drug use: No  . Sexual activity: Yes    Partners: Male    Birth control/protection: Surgical    Comment: Vasectomy  Other Topics Concern  . Not on file  Social History Narrative  . Not on file   Social Determinants of Health   Financial Resource Strain: Not on file  Food Insecurity: Not on file  Transportation Needs: Not on file  Physical Activity: Not  on file  Stress: Not on file  Social Connections: Not on file  Intimate Partner Violence: Not on file    Past Surgical Hx:  Past Surgical History:  Procedure Laterality Date  . CESAREAN SECTION    . INSERTION OF MESH N/A 12/06/2017   Procedure: INSERTION OF MESH;  Surgeon: Ralene Ok, MD;  Location: [place];  Service: General;  Laterality: N/A;  . LITHOTRIPSY Right 1998  . LYSIS OF ADHESION N/A 07/21/2020   Procedure: LYSIS OF ADHESIONS;  Surgeon: Everitt Amber, MD;  Location: [facility];  Service: Gynecology;  Laterality: N/A;  . REPEAT CESAREAN SECTION    . ROBOTIC ASSISTED TOTAL HYSTERECTOMY WITH BILATERAL SALPINGO OOPHERECTOMY Bilateral 07/21/2020   Procedure: XI ROBOTIC ASSISTED TOTAL HYSTERECTOMY WITH BILATERAL SALPINGO OOPHORECTOMY, MINI LAPARATOMY;  Surgeon: Everitt Amber, MD;  Location: [facility];   Service: Gynecology;  Laterality: Bilateral;  GREATER THAN 250 GRAMS  . SENTINEL NODE BIOPSY N/A 07/21/2020   Procedure: SENTINEL LYMPH  NODE BIOPSY;  Surgeon: Everitt Amber, MD;  Location: [facility];  Service: Gynecology;  Laterality: N/A;  . UMBILICAL HERNIA REPAIR N/A 12/06/2017   Procedure: LAPAROSCOPIC UMBILICAL HERNIA;  Surgeon: Ralene Ok, MD;  Location: [place];  Service: General;  Laterality: N/A;    Past Medical Hx:  Past Medical History:  Diagnosis Date  . Arthritis   . Asthma    seasonal   . Bladder incontinence    cant hold urine  . Claustrophobia   . Complication of anesthesia    facial itching,"hot spot"  . GERD (gastroesophageal reflux disease)   . History of kidney stones   . Hypertension   . Lyme disease 5/14  . Menorrhagia with irregular cycle   . Sleep apnea    cpap  . Spinal stenosis     Past Gynecological History:  See HPI No LMP recorded.  Family Hx:  Family History  Problem Relation Age of Onset  . Liver disease Mother        nonalcoholic fatty liver disease  . Hepatitis C Mother   . Hypertension Mother   . Hyperthyroidism Mother   . Cancer Father        lung cancer  . Hypothyroidism Father   . ALS Sister   . Mental illness Sister   . Breast cancer Maternal Grandmother     Review of Systems:  Constitutional  Feels well,    ENT Normal appearing ears and nares bilaterally Skin/Breast  No rash, sores, jaundice, itching, dryness Cardiovascular  No chest pain, shortness of breath, or edema  Pulmonary  No cough or wheeze.  Gastro Intestinal  No nausea, vomitting, or diarrhoea. No bright red blood per rectum, no abdominal pain, change in bowel movement, or constipation.  Genito Urinary  + frequency, + dysuria, no bleeding Musculo Skeletal  No myalgia, arthralgia, joint swelling or pain  Neurologic  No weakness, numbness, change in gait,  Psychology  No depression, anxiety, insomnia.   Vitals:  Blood pressure (!) 132/92, pulse (!) 104,  temperature 98 F (36.7 C), temperature source Tympanic, resp. rate 20, height _0  (1.676 m), weight 297 lb (134.7 kg), SpO2 100 %.  Physical Exam: WD in NAD Neck  Supple NROM, without any enlargements.  Lymph Node Survey No cervical supraclavicular or inguinal adenopathy Cardiovascular  Well perfused peripheries Lungs  No increased WOB Skin  No rash/lesions/breakdown  Psychiatry  Alert and oriented to person, place, and time  Abdomen  Normoactive bowel sounds, abdomen soft, non-tender and obese without  evidence of hernia. Well healed incisions Back No CVA tenderness Genito Urinary  Vaginal cuff well healed, no bleeding, in tact.  Rectal  deferred  Extremities  No bilateral cyanosis, clubbing or edema.   30 minutes of direct face to face counseling time was spent with the patient. This included discussion about prognosis, therapy recommendations and postoperative side effects and are beyond the scope of routine postoperative care.   Thereasa Solo, MD  08/12/2020, 1:33 PM

## 2020-08-11 ENCOUNTER — Encounter: Payer: Self-pay | Admitting: Gynecologic Oncology

## 2020-08-12 ENCOUNTER — Inpatient Hospital Stay: Payer: BC Managed Care – PPO | Attending: Gynecologic Oncology

## 2020-08-12 ENCOUNTER — Other Ambulatory Visit: Payer: Self-pay

## 2020-08-12 ENCOUNTER — Encounter: Payer: Self-pay | Admitting: Gynecologic Oncology

## 2020-08-12 ENCOUNTER — Inpatient Hospital Stay (HOSPITAL_BASED_OUTPATIENT_CLINIC_OR_DEPARTMENT_OTHER): Payer: BC Managed Care – PPO | Admitting: Gynecologic Oncology

## 2020-08-12 VITALS — BP 132/92 | HR 104 | Temp 98.0°F | Resp 20 | Ht 66.0 in | Wt 297.0 lb

## 2020-08-12 DIAGNOSIS — C541 Malignant neoplasm of endometrium: Secondary | ICD-10-CM | POA: Diagnosis not present

## 2020-08-12 DIAGNOSIS — Z90722 Acquired absence of ovaries, bilateral: Secondary | ICD-10-CM | POA: Insufficient documentation

## 2020-08-12 DIAGNOSIS — R300 Dysuria: Secondary | ICD-10-CM | POA: Insufficient documentation

## 2020-08-12 DIAGNOSIS — Z7189 Other specified counseling: Secondary | ICD-10-CM

## 2020-08-12 DIAGNOSIS — Z90710 Acquired absence of both cervix and uterus: Secondary | ICD-10-CM

## 2020-08-12 LAB — URINALYSIS, COMPLETE (UACMP) WITH MICROSCOPIC
Bilirubin Urine: NEGATIVE
Glucose, UA: NEGATIVE mg/dL
Hgb urine dipstick: NEGATIVE
Ketones, ur: NEGATIVE mg/dL
Leukocytes,Ua: NEGATIVE
Nitrite: NEGATIVE
Protein, ur: NEGATIVE mg/dL
Specific Gravity, Urine: 1.014 (ref 1.005–1.030)
pH: 5 (ref 5.0–8.0)

## 2020-08-12 NOTE — Patient Instructions (Signed)
Dr Denman George is checking your urine to ensure no infection.  Please contact Dr Serita Grit office (at [phone]) in April to request an appointment with her for July, 2022.

## 2020-08-13 ENCOUNTER — Telehealth: Payer: Self-pay

## 2020-08-13 NOTE — Telephone Encounter (Signed)
Told Ms Draughn that the urine does not show any sign of infection per Melissa Cross,NP.

## 2020-10-08 ENCOUNTER — Telehealth: Payer: Self-pay | Admitting: *Deleted

## 2020-10-08 NOTE — Telephone Encounter (Signed)
Patient called to schedule a follow up in April. Appt not sure until July, patient to call back in May to schedule appt

## 2020-12-14 ENCOUNTER — Other Ambulatory Visit: Payer: Self-pay | Admitting: Family Medicine

## 2020-12-14 ENCOUNTER — Telehealth: Payer: Self-pay | Admitting: *Deleted

## 2020-12-14 DIAGNOSIS — Z1231 Encounter for screening mammogram for malignant neoplasm of breast: Secondary | ICD-10-CM

## 2020-12-14 NOTE — Telephone Encounter (Signed)
Patient called and scheduled a follow up appt for July 7th with Dr Denman George

## 2021-02-02 ENCOUNTER — Encounter: Payer: Self-pay | Admitting: Gynecologic Oncology

## 2021-02-04 ENCOUNTER — Inpatient Hospital Stay: Payer: BC Managed Care – PPO | Attending: Gynecologic Oncology | Admitting: Gynecologic Oncology

## 2021-02-04 ENCOUNTER — Encounter: Payer: Self-pay | Admitting: Gynecologic Oncology

## 2021-02-04 ENCOUNTER — Other Ambulatory Visit: Payer: Self-pay

## 2021-02-04 VITALS — BP 123/80 | HR 88 | Temp 96.9°F | Resp 16 | Ht 66.0 in | Wt 280.6 lb

## 2021-02-04 DIAGNOSIS — Z90710 Acquired absence of both cervix and uterus: Secondary | ICD-10-CM | POA: Diagnosis not present

## 2021-02-04 DIAGNOSIS — Z6841 Body Mass Index (BMI) 40.0 and over, adult: Secondary | ICD-10-CM | POA: Insufficient documentation

## 2021-02-04 DIAGNOSIS — R32 Unspecified urinary incontinence: Secondary | ICD-10-CM | POA: Insufficient documentation

## 2021-02-04 DIAGNOSIS — C541 Malignant neoplasm of endometrium: Secondary | ICD-10-CM | POA: Insufficient documentation

## 2021-02-04 DIAGNOSIS — I10 Essential (primary) hypertension: Secondary | ICD-10-CM | POA: Insufficient documentation

## 2021-02-04 DIAGNOSIS — Z79899 Other long term (current) drug therapy: Secondary | ICD-10-CM | POA: Insufficient documentation

## 2021-02-04 DIAGNOSIS — N898 Other specified noninflammatory disorders of vagina: Secondary | ICD-10-CM | POA: Diagnosis not present

## 2021-02-04 DIAGNOSIS — Z90722 Acquired absence of ovaries, bilateral: Secondary | ICD-10-CM | POA: Insufficient documentation

## 2021-02-04 DIAGNOSIS — K219 Gastro-esophageal reflux disease without esophagitis: Secondary | ICD-10-CM | POA: Diagnosis not present

## 2021-02-04 NOTE — Patient Instructions (Signed)
Please notify Dr Denman George at phone number [phone] if you notice vaginal bleeding, new pelvic or abdominal pains, bloating, feeling full easy, or a change in bladder or bowel function.   Dr Denman George took a biopsy from the top of the vagina today. Her office will contact you with the results.  Please follow-up with Dr Sabra Heck in 6 months.  Dr Denman George is departing the [facility] in October, 2022. Her partners and colleagues including Dr Berline Lopes, Dr Delsa Sale and [name], Nurse Practitioner will be available to continue your care.   You are next scheduled to return to the Gynecologic Oncology office at the [facility] in July, 2023. Please call [phone] in April, 2023 to request an appointment for July with Dr Serita Grit partner, Dr Berline Lopes or Dr Delsa Sale.

## 2021-02-04 NOTE — Progress Notes (Signed)
Gynecologic Oncology Follow-up Note  Chief Complaint:  Chief Complaint  Patient presents with   Endometrial cancer [facility])   Assessment/Plan:  Katelyn. Arayla Cohen  is a 55 y.o.  year old P2 with a history of stage IA grade 1 endometrial cancer (MMRd) s/p robotic hysterectomy and staging on 07/21/20.  Low risk factors. No adjuvant therapy.  Granulation tissue at cuff removed today. Will confirm pathology is not malignant.  I discussed risk for recurrence and typical symptoms encouraged her to notify us of these should they develop between visits.  I recommend she have follow-up every 6 months for 5 years in accordance with NCCN guidelines. Those visits should include symptom assessment, physical exam and pelvic examination. Pap smears are not indicated or recommended in the routine surveillance of endometrial cancer.  She will follow-up with Dr Berline Lopes or Dr Delsa Sale as I am leaving the practice in October.   HPI: Katelyn Cohen is a 55 year old P 2 who was seen in consultation at the request of Dr Talbert Nan for evaluation and treatment of grade 1 endometrial cancer.   She reported lifelong symptoms of oligo menorrhagia.  This did not necessarily change however she had a particularly heavy menstrual period in October 2021 and a planned, scheduled GYN annual surveillance visit in November 2021 and mentioned this symptom at that time.   She saw Dr Talbert Nan for a routine scheduled GYN visit on 05/22/20. Pap at that visit was normal with negative high-risk HPV. Work-up of symptoms included a transvaginal ultrasound scan and endometrial Pipelle. Transvaginal US on 06/11/2020 showed a uterus measuring 10.9 x 6.5 x 7.5 cm with an endometrial thickness of 2 cm.  The ovaries were not visualized due to body habitus. Endometrial sampling with office Pipelle was performed on 06/11/2020 and showed FIGO grade 1 endometrioid endometrial adenocarcinoma, MMRd (loss of expression of MLH1, and PMS2).   On  07/21/20 she underwent robotic assisted total hysterectomy, BSO, SLN biopsy (bilateral). Intraoperative findings were significant for morbid obesity including dense intraperitoneal and retroperitoneal adiposity (BMI 49) which made visualization of visceral limited. A fibroid 15cm uterus which was densely adherent to the anterior abdominal wall due to cesarean section adhesions. Obliterated anterior cul de sac. There was no gross extrauterine disease. Dense omental adhesions to anterior abdominal wall and mesh. Surgery was challenging due to obesity and adhesions, but uncomplicated.  Final pathology revealed a FIGO grade 1 endometrioid endometrial adenocarcinoma with 2 of 97m (inner half) myometrial invasion. There was no LVSI, lymph node involvement, cervical or ovarian involvement. MMath[place]with hypermethylation of MLH1 present therefore Lynch Syndrome testing not ordered.  She was determined to have a low risk cancer, stage IA grade 1 and no adjuvant therapy was recommended in accordance with NCCN guidelines.  Interval Hx:  Since surgery she has done well with complaints of dysuria and frequency.    Current Meds:  Outpatient Encounter Medications as of 02/04/2021  Medication Sig   cetirizine (ZYRTEC) 10 MG tablet Take 5 mg by mouth daily as needed for allergies.   cholecalciferol (VITAMIN D3) 25 MCG (1000 UNIT) tablet Take 1,000 Units by mouth daily.   diclofenac sodium (VOLTAREN) 1 % GEL Apply 2 g topically 4 (four) times daily as needed (pain).   losartan-hydrochlorothiazide (HYZAAR) 100-25 MG tablet Take 1 tablet by mouth daily.   ondansetron (ZOFRAN) 4 MG tablet Take 4 mg by mouth every 8 (eight) hours as needed for nausea or vomiting.    Semaglutide-Weight Management (WEGOVY) 2.4 MG/0.75ML SOAJ  Inject 2.4 mg into the skin once a week.   solifenacin (VESICARE) 5 MG tablet TAKE 1 TABLET (5 MG TOTAL) BY MOUTH DAILY. ONE BY MOUTH EVERY DAY (Patient taking differently: Take 5 mg by mouth daily. TAKE 1  TABLET (5 MG TOTAL) BY MOUTH DAILY. ONE BY MOUTH EVERY DAY)   acetaminophen (TYLENOL) 500 MG tablet Take 1,000 mg by mouth every 6 (six) hours as needed for moderate pain. (Patient not taking: Reported on 02/02/2021)   ibuprofen (ADVIL) 200 MG tablet Take 400 mg by mouth every 6 (six) hours as needed. (Patient not taking: Reported on 02/02/2021)   naproxen (NAPROSYN) 500 MG tablet Take 500 mg by mouth daily. (Patient not taking: No sig reported)   PROAIR HFA 108 (90 Base) MCG/ACT inhaler Inhale 1-2 puffs into the lungs every 6 (six) hours as needed for wheezing or shortness of breath.  (Patient not taking: Reported on 02/02/2021)   [DISCONTINUED] Semaglutide-Weight Management (WEGOVY) 0.5 MG/0.5ML SOAJ Inject 0.5 mg into the skin once a week.    No facility-administered encounter medications on file as of 02/04/2021.    Allergy:  Allergies  Allergen Reactions   Ace Inhibitors Cough   Penicillins Itching and Swelling    PATIENT HAS HAD A PCN REACTION WITH IMMEDIATE RASH, FACIAL/TONGUE/THROAT SWELLING, SOB, OR LIGHTHEADEDNESS WITH HYPOTENSION:  NO  Has patient had a PCN reaction causing severe rash involving mucus membranes or skin necrosis: No Has patient had a PCN reaction that required hospitalization: No Patient has had a PCN reaction within the last 10 years.  No    Codeine Other (See Comments)   Hydrocodone Itching    Face     Social Hx:   Social History   Socioeconomic History   Marital status: Married    Spouse name: Not on file   Number of children: 2   Years of education: Not on file   Highest education level: Not on file  Occupational History   Occupation: admistrative  Tobacco Use   Smoking status: Never   Smokeless tobacco: Never  Vaping Use   Vaping Use: Never used  Substance and Sexual Activity   Alcohol use: No   Drug use: No   Sexual activity: Yes    Partners: Male    Birth control/protection: Surgical    Comment: Vasectomy  Other Topics Concern   Not on file   Social History Narrative   Not on file   Social Determinants of Health   Financial Resource Strain: Not on file  Food Insecurity: Not on file  Transportation Needs: Not on file  Physical Activity: Not on file  Stress: Not on file  Social Connections: Not on file  Intimate Partner Violence: Not on file    Past Surgical Hx:  Past Surgical History:  Procedure Laterality Date   CESAREAN SECTION     INSERTION OF MESH N/A 12/06/2017   Procedure: INSERTION OF MESH;  Surgeon: Ralene Ok, MD;  Location: [place];  Service: General;  Laterality: N/A;   LITHOTRIPSY Right 1998   LYSIS OF ADHESION N/A 07/21/2020   Procedure: LYSIS OF ADHESIONS;  Surgeon: Everitt Amber, MD;  [facility]: [facility];  Service: Gynecology;  Laterality: N/A;   REPEAT CESAREAN SECTION     ROBOTIC ASSISTED TOTAL HYSTERECTOMY WITH BILATERAL SALPINGO OOPHERECTOMY Bilateral 07/21/2020   Procedure: XI ROBOTIC ASSISTED TOTAL HYSTERECTOMY WITH BILATERAL SALPINGO OOPHORECTOMY, MINI LAPARATOMY;  Surgeon: Everitt Amber, MD;  Location: [facility];  Service: Gynecology;  Laterality: Bilateral;  GREATER THAN 250  GRAMS   SENTINEL NODE BIOPSY N/A 07/21/2020   Procedure: SENTINEL LYMPH  NODE BIOPSY;  Surgeon: Everitt Amber, MD;  [facility];  Service: Gynecology;  Laterality: N/A;   UMBILICAL HERNIA REPAIR N/A 12/06/2017   Procedure: LAPAROSCOPIC UMBILICAL HERNIA;  Surgeon: Ralene Ok, MD;  Location: [place];  Service: General;  Laterality: N/A;    Past Medical Hx:  Past Medical History:  Diagnosis Date   Arthritis    Asthma    seasonal    Bladder incontinence    cant hold urine   Claustrophobia    Complication of anesthesia    facial itching,"hot spot"   GERD (gastroesophageal reflux disease)    History of kidney stones    Hypertension    Lyme disease 5/14   Menorrhagia with irregular cycle    Sleep apnea    cpap   Spinal stenosis     Past Gynecological History:  See HPI No LMP recorded.  Family Hx:  Family History   Problem Relation Age of Onset   Liver disease Mother        nonalcoholic fatty liver disease   Hepatitis C Mother    Hypertension Mother    Hyperthyroidism Mother    Cancer Father        lung cancer   Hypothyroidism Father    ALS Sister    Mental illness Sister    Breast cancer Maternal Grandmother     Review of Systems:  Constitutional  Feels well,    ENT Normal appearing ears and nares bilaterally Skin/Breast  No rash, sores, jaundice, itching, dryness Cardiovascular  No chest pain, shortness of breath, or edema  Pulmonary  No cough or wheeze.  Gastro Intestinal  No nausea, vomitting, or diarrhoea. No bright red blood per rectum, no abdominal pain, change in bowel movement, or constipation.  Genito Urinary  + frequency, + dysuria, no bleeding Musculo Skeletal  No myalgia, arthralgia, joint swelling or pain  Neurologic  No weakness, numbness, change in gait,  Psychology  No depression, anxiety, insomnia.   Vitals:  Blood pressure 123/80, pulse 88, temperature (!) 96.9 F (36.1 C), temperature source Tympanic, resp. rate 16, height _0  (1.676 m), weight 280 lb 9.6 oz (127.3 kg), SpO2 100 %.  Physical Exam: WD in NAD Neck  Supple NROM, without any enlargements.  Lymph Node Survey No cervical supraclavicular or inguinal adenopathy Cardiovascular  Well perfused peripheries Lungs  No increased WOB Skin  No rash/lesions/breakdown  Psychiatry  Alert and oriented to person, place, and time  Abdomen  Normoactive bowel sounds, abdomen soft, non-tender and obese without evidence of hernia. Soft incisions Back No CVA tenderness Genito Urinary  Vaginal cuff well healed,there was a 38m frond of likely granulation tissue extending in an exophytic manner from the midline vaginal cuff. Removed with a ring forcep and made hemostatic with silver nitrate. Rectal  deferred  Extremities  No bilateral cyanosis, clubbing or edema.   EThereasa Solo MD  02/04/2021, 1:24  PM

## 2021-02-08 ENCOUNTER — Ambulatory Visit
Admission: RE | Admit: 2021-02-08 | Discharge: 2021-02-08 | Disposition: A | Discharge status: Home / Self Care | Payer: BC Managed Care – PPO | Source: Ambulatory Visit | Attending: Family Medicine | Admitting: Family Medicine

## 2021-02-08 ENCOUNTER — Other Ambulatory Visit: Payer: Self-pay

## 2021-02-08 ENCOUNTER — Telehealth: Payer: Self-pay

## 2021-02-08 DIAGNOSIS — Z1231 Encounter for screening mammogram for malignant neoplasm of breast: Secondary | ICD-10-CM

## 2021-02-08 LAB — SURGICAL PATHOLOGY

## 2021-02-10 NOTE — Telephone Encounter (Signed)
Told Katelyn Cohen that the pathology from the vaginal cuff showed healing tissue. No cancer or pre-cancer seen. Pt verbalized understanding.

## 2021-02-16 ENCOUNTER — Telehealth: Payer: Self-pay

## 2021-02-16 NOTE — Telephone Encounter (Signed)
Faxed request for refill back to [facility] with denial for refill. Pt given medication for surgery.  She can purchase OTC if she would like more tablets.

## 2021-05-24 ENCOUNTER — Other Ambulatory Visit: Payer: Self-pay

## 2021-05-24 ENCOUNTER — Encounter (HOSPITAL_BASED_OUTPATIENT_CLINIC_OR_DEPARTMENT_OTHER): Payer: Self-pay | Admitting: Obstetrics & Gynecology

## 2021-05-24 ENCOUNTER — Ambulatory Visit (INDEPENDENT_AMBULATORY_CARE_PROVIDER_SITE_OTHER): Payer: BC Managed Care – PPO | Admitting: Obstetrics & Gynecology

## 2021-05-24 VITALS — BP 142/101 | HR 108 | Ht 65.0 in | Wt 273.6 lb

## 2021-05-24 DIAGNOSIS — Z23 Encounter for immunization: Secondary | ICD-10-CM

## 2021-05-24 DIAGNOSIS — Z6841 Body Mass Index (BMI) 40.0 and over, adult: Secondary | ICD-10-CM | POA: Diagnosis not present

## 2021-05-24 DIAGNOSIS — I10 Essential (primary) hypertension: Secondary | ICD-10-CM

## 2021-05-24 DIAGNOSIS — R350 Frequency of micturition: Secondary | ICD-10-CM | POA: Diagnosis not present

## 2021-05-24 DIAGNOSIS — Z8542 Personal history of malignant neoplasm of other parts of uterus: Secondary | ICD-10-CM

## 2021-05-24 DIAGNOSIS — Z01419 Encounter for gynecological examination (general) (routine) without abnormal findings: Secondary | ICD-10-CM | POA: Diagnosis not present

## 2021-05-24 DIAGNOSIS — E559 Vitamin D deficiency, unspecified: Secondary | ICD-10-CM

## 2021-05-24 DIAGNOSIS — C541 Malignant neoplasm of endometrium: Secondary | ICD-10-CM

## 2021-05-24 MED ORDER — SOLIFENACIN SUCCINATE 5 MG PO TABS: ORAL_TABLET | ORAL | 4 refills | Status: DC

## 2021-05-24 NOTE — Progress Notes (Signed)
55 y.o. [id number] Married White or Caucasian female here for annual exam.  Denies vaginal bleeding.  Had grade 1, stage 1 endometrial cancer diagnosed 06/2020.    On Wegovy.  Has lost almost 50 pounds.  Has considered bariatric surgery but medication has worked well for her.  Cost is now $500/month.  Patient's last menstrual period was 07/10/2020.          Sexually active: Yes.    The current method of family planning is vasectomy, hysterectomy Exercising: Yes.     Smoker:  no  Health Maintenance: Pap:  05/22/2020 Negative History of abnormal Pap:  yes MMG:  02/08/2021 Negative Colonoscopy:  cologuard negative 2020, plan colonoscopy next year BMD:   not indicated Screening Labs: last done 2020   reports that she has never smoked. She has never used smokeless tobacco. She reports that she does not drink alcohol and does not use drugs.  Past Medical History:  Diagnosis Date   Arthritis    Asthma    seasonal    Bladder incontinence    cant hold urine   Claustrophobia    Complication of anesthesia    facial itching,"hot spot"   Endometrial cancer, grade I ([place]) 06/2020   GERD (gastroesophageal reflux disease)    History of kidney stones    Hypertension    Lyme disease 11/2012   Sleep apnea    cpap   Spinal stenosis     Past Surgical History:  Procedure Laterality Date   CESAREAN SECTION     INSERTION OF MESH N/A 12/06/2017   Procedure: INSERTION OF MESH;  Surgeon: Ralene Ok, MD;  Location: [facility];  Service: General;  Laterality: N/A;   LITHOTRIPSY Right 1998   LYSIS OF ADHESION N/A 07/21/2020   Procedure: LYSIS OF ADHESIONS;  Surgeon: Everitt Amber, MD;  [facility];  Service: Gynecology;  Laterality: N/A;   REPEAT CESAREAN SECTION     ROBOTIC ASSISTED TOTAL HYSTERECTOMY WITH BILATERAL SALPINGO OOPHERECTOMY Bilateral 07/21/2020   Procedure: XI ROBOTIC ASSISTED TOTAL HYSTERECTOMY WITH BILATERAL SALPINGO OOPHORECTOMY, MINI LAPARATOMY;  Surgeon: Everitt Amber, MD;   [facility];  Service: Gynecology;  Laterality: Bilateral;  GREATER THAN 250 GRAMS   SENTINEL NODE BIOPSY N/A 07/21/2020   Procedure: SENTINEL LYMPH  NODE BIOPSY;  Surgeon: Everitt Amber, MD;  [facility];  Service: Gynecology;  Laterality: N/A;   UMBILICAL HERNIA REPAIR N/A 12/06/2017   Procedure: LAPAROSCOPIC UMBILICAL HERNIA;  Surgeon: Ralene Ok, MD;  [facility];  Service: General;  Laterality: N/A;    Current Outpatient Medications  Medication Sig Dispense Refill   cetirizine (ZYRTEC) 10 MG tablet Take 5 mg by mouth daily as needed for allergies.     cholecalciferol (VITAMIN D3) 25 MCG (1000 UNIT) tablet Take 1,000 Units by mouth daily.     diclofenac sodium (VOLTAREN) 1 % GEL Apply 2 g topically 4 (four) times daily as needed (pain).     losartan-hydrochlorothiazide (HYZAAR) 100-25 MG tablet Take 1 tablet by mouth daily.     ondansetron (ZOFRAN) 4 MG tablet Take 4 mg by mouth every 8 (eight) hours as needed for nausea or vomiting.      PROAIR HFA 108 (90 Base) MCG/ACT inhaler Inhale 1-2 puffs into the lungs every 6 (six) hours as needed for wheezing or shortness of breath.  0   Semaglutide-Weight Management (WEGOVY) 2.4 MG/0.75ML SOAJ Inject 2.4 mg into the skin once a week.     solifenacin (VESICARE) 5 MG tablet TAKE 1  TABLET (5 MG TOTAL) BY MOUTH DAILY. ONE BY MOUTH EVERY DAY (Patient taking differently: Take 5 mg by mouth daily. TAKE 1 TABLET (5 MG TOTAL) BY MOUTH DAILY. ONE BY MOUTH EVERY DAY) 90 tablet 4   vitamin B-12 (CYANOCOBALAMIN) 100 MCG tablet Take 100 mcg by mouth daily.     acetaminophen (TYLENOL) 500 MG tablet Take 1,000 mg by mouth every 6 (six) hours as needed for moderate pain. (Patient not taking: No sig reported)     ibuprofen (ADVIL) 200 MG tablet Take 400 mg by mouth every 6 (six) hours as needed. (Patient not taking: No sig reported)     naproxen (NAPROSYN) 500 MG tablet Take 500 mg by mouth daily. (Patient not taking: No sig reported)     No  current facility-administered medications for this visit.    Family History  Problem Relation Age of Onset   Liver disease Mother        nonalcoholic fatty liver disease   Hepatitis C Mother    Hypertension Mother    Hyperthyroidism Mother    Cancer Father        lung cancer   Hypothyroidism Father    ALS Sister    Mental illness Sister    Breast cancer Maternal Grandmother     Review of Systems  All other systems reviewed and are negative.  Exam:   BP (!) 142/101 (BP Location: Right Arm, Patient Position: Sitting, Cuff Size: Large)   Pulse (!) 108   Ht _0  (1.651 m)   Wt 273 lb 9.6 oz (124.1 kg)   LMP 07/10/2020   BMI 45.53 kg/m   Height: _1  (165.1 cm)  General appearance: alert, cooperative and appears stated age Head: Normocephalic, without obvious abnormality, atraumatic Neck: no adenopathy, supple, symmetrical, trachea midline and thyroid normal to inspection and palpation Lungs: clear to auscultation bilaterally Breasts: normal appearance, no masses or tenderness Heart: regular rate and rhythm Abdomen: soft, non-tender; bowel sounds normal; no masses,  no organomegaly Extremities: extremities normal, atraumatic, no cyanosis or edema Skin: Skin color, texture, turgor normal. No rashes or lesions Lymph nodes: Cervical, supraclavicular, and axillary nodes normal. No abnormal inguinal nodes palpated Neurologic: Grossly normal  Pelvic: External genitalia:  no lesions              Urethra:  normal appearing urethra with no masses, tenderness or lesions              Bartholins and Skenes: normal                 Vagina: normal appearing vagina with normal color and no discharge, no lesions, no granulation tissue present              Cervix: absent              Pap taken: No. Bimanual Exam:  Uterus:  uterus absent              Adnexa: no mass, fullness, tenderness               Rectovaginal: Confirms               Anus:  normal sphincter tone, no  lesions  Chaperone, Octaviano Batty, CMA, was present for exam.  Assessment/Plan: 1. Well woman exam with routine gynecological exam - pap smear not indicated - MMG 02/08/2021 - did cologuard 2020 and this was negative.  Given endometrial cancer history this past year, feel pt should proceed with  colonoscopy next year. She is comfortable with this plan. - plan BMD closer to age 29 - lab work done with work and [employer], [place] - care gaps reviewed/updated  2. Endometrial cancer ([place]) -s/p TLH/BSO, sentinel node sampling 07/2020, stage IA, grade I -hypermethylation of MLH1 which is more common with sporadic cancers so genetic testing not recommended.  Will confirm with gyn/onc regarding this as well  3. Urinary frequency/OAB - solifenacin (VESICARE) 5 MG tablet; TAKE 1 TABLET (5 MG TOTAL) BY MOUTH DAILY. ONE BY MOUTH EVERY DAY  Dispense: 90 tablet; Refill: 4  4. BMI 45.0-49.9, adult ([place]) - pt actively working on weight loss with [facility] program  5. Vitamin D deficiency  6. Essential hypertension - pt aware of BP results today

## 2021-05-27 ENCOUNTER — Ambulatory Visit: Payer: BC Managed Care – PPO

## 2021-06-11 ENCOUNTER — Other Ambulatory Visit: Payer: Self-pay | Admitting: Obstetrics & Gynecology

## 2021-06-11 DIAGNOSIS — R350 Frequency of micturition: Secondary | ICD-10-CM

## 2021-09-02 ENCOUNTER — Telehealth: Payer: Self-pay

## 2021-09-02 NOTE — Telephone Encounter (Signed)
Received call from Ms. Arenas needing to schedule a 6 month follow up appt. Appt made with Dr. Delsa Sale on 11/24/2021 at 10:00 am. Pt agreeable to date and time of appt.

## 2021-11-16 DIAGNOSIS — E668 Other obesity: Secondary | ICD-10-CM | POA: Diagnosis not present

## 2021-11-16 DIAGNOSIS — R7303 Prediabetes: Secondary | ICD-10-CM | POA: Diagnosis not present

## 2021-11-16 DIAGNOSIS — J302 Other seasonal allergic rhinitis: Secondary | ICD-10-CM | POA: Diagnosis not present

## 2021-11-16 DIAGNOSIS — Z713 Dietary counseling and surveillance: Secondary | ICD-10-CM | POA: Diagnosis not present

## 2021-11-16 DIAGNOSIS — Z6841 Body Mass Index (BMI) 40.0 and over, adult: Secondary | ICD-10-CM | POA: Diagnosis not present

## 2021-11-16 DIAGNOSIS — I10 Essential (primary) hypertension: Secondary | ICD-10-CM | POA: Diagnosis not present

## 2021-11-23 ENCOUNTER — Encounter: Payer: Self-pay | Admitting: Obstetrics & Gynecology

## 2021-11-24 ENCOUNTER — Encounter: Payer: Self-pay | Admitting: Obstetrics & Gynecology

## 2021-11-24 ENCOUNTER — Other Ambulatory Visit: Payer: Self-pay

## 2021-11-24 ENCOUNTER — Inpatient Hospital Stay: Payer: BC Managed Care – PPO | Attending: Obstetrics & Gynecology | Admitting: Obstetrics & Gynecology

## 2021-11-24 VITALS — BP 114/79 | HR 91 | Temp 98.5°F | Resp 16 | Ht 65.0 in | Wt 262.9 lb

## 2021-11-24 DIAGNOSIS — C541 Malignant neoplasm of endometrium: Secondary | ICD-10-CM

## 2021-11-24 DIAGNOSIS — Z8542 Personal history of malignant neoplasm of other parts of uterus: Secondary | ICD-10-CM | POA: Diagnosis not present

## 2021-11-24 NOTE — Assessment & Plan Note (Addendum)
56 y.o.  year old P2 with a history of stage IA grade 1 endometrial cancer ([facility]) s/p robotic hysterectomy and staging on 07/21/20. ??Negative symptom review, normal exam.  No evidence of recurrence ?FPHL ?? ??>encouraged evaluation by dermatologist ?>continue follow-up every 6 months for 5 years in accordance with NCCN guidelines. ?

## 2021-11-24 NOTE — Patient Instructions (Signed)
Return 1 year. 

## 2021-11-24 NOTE — Progress Notes (Signed)
Follow Up Note: Gyn-Onc ? ?Lajoyce Corners 56 y.o. female ? ?CC: She presents for a f/u visit ? ? ?HPI: The oncology history was reviewed. ? ?Interval History: She denies any vaginal bleeding, abdominal/pelvic pain, cough, lethargy or increasing abdominal girth. Granulation tissue at the cuff was excised at the last visit; the histology was confirmatory.  Excessive hair shedding x 1 yr.  No family h/o FPHL. ? ?Review of Systems  ?Review of Systems  ?Constitutional:  Negative for malaise/fatigue and weight loss.  ?Respiratory:  Negative for shortness of breath and wheezing.   ?Cardiovascular:  Negative for chest pain and leg swelling.  ?Gastrointestinal:  Negative for abdominal pain, blood in stool, constipation, nausea and vomiting.  ?Genitourinary:  Negative for dysuria, frequency, hematuria and urgency.  ?Musculoskeletal:  Negative for joint pain and myalgias.  ?Neurological:  Negative for weakness.  ?Psychiatric/Behavioral:  Negative for depression. The patient does not have insomnia.   ? ?Current medications, allergy, social history, past surgical history, past medical history, family history were all reviewed. ? ? ? ?Vitals:  BP 114/79 (BP Location: Left Arm, Patient Position: Sitting)   Pulse 91   Temp 98.5 ?F (36.9 ?C) (Tympanic)   Resp 16   Ht '5\' 5"'$  (1.651 m)   Wt 262 lb 14.4 oz (119.3 kg)   LMP 07/10/2020   SpO2 99%   BMI 43.75 kg/m?  ? ? ?Physical Exam:  ?Physical Exam ?Exam conducted with a chaperone present.  ?Constitutional:   ?   General: She is not in acute distress. ?Cardiovascular:  ?   Rate and Rhythm: Normal rate and regular rhythm.  ?Pulmonary:  ?   Effort: Pulmonary effort is normal.  ?   Breath sounds: Normal breath sounds. No wheezing or rhonchi.  ?Abdominal:  ?   Palpations: Abdomen is soft.  ?   Tenderness: There is no abdominal tenderness. There is no right CVA tenderness or left CVA tenderness.  ?   Hernia: No hernia is present.  ?Genitourinary: ?   General: Normal vulva.  ?    Urethra: No urethral lesion.  ?   Vagina: No lesions. No bleeding ?Musculoskeletal:  ?   Cervical back: Neck supple.  ?   Right lower leg: No edema.  ?   Left lower leg: No edema.  ?Lymphadenopathy:  ?   Upper Body:  ?   Right upper body: No supraclavicular adenopathy.  ?   Left upper body: No supraclavicular adenopathy.  ?   Lower Body: No right inguinal adenopathy. No left inguinal adenopathy.  ?Skin: ?   Findings: No rash.  ?Neurological:  ?   Mental Status: She is oriented to person, place, and time.  ? ?Assessment/Plan:  ?Endometrial cancer ([place]) 56 y.o.  year old P2 with a history of stage IA grade 1 endometrial cancer (MMRd) s/p robotic hysterectomy and staging on 07/21/20. ? Negative symptom review, normal exam.  No evidence of recurrence ?FPHL ?  ? >encouraged evaluation by dermatologist ?>continue follow-up every 6 months for 5 years in accordance with NCCN guidelines.  ? ?[name], MD  ?

## 2022-04-24 DIAGNOSIS — B349 Viral infection, unspecified: Secondary | ICD-10-CM | POA: Diagnosis not present

## 2022-06-02 ENCOUNTER — Encounter (HOSPITAL_BASED_OUTPATIENT_CLINIC_OR_DEPARTMENT_OTHER): Payer: Self-pay | Admitting: Obstetrics & Gynecology

## 2022-06-02 ENCOUNTER — Ambulatory Visit (INDEPENDENT_AMBULATORY_CARE_PROVIDER_SITE_OTHER): Payer: BC Managed Care – PPO | Admitting: Obstetrics & Gynecology

## 2022-06-02 VITALS — BP 116/88 | HR 99 | Ht 66.0 in | Wt 261.8 lb

## 2022-06-02 DIAGNOSIS — Z01419 Encounter for gynecological examination (general) (routine) without abnormal findings: Secondary | ICD-10-CM

## 2022-06-02 DIAGNOSIS — N3946 Mixed incontinence: Secondary | ICD-10-CM

## 2022-06-02 DIAGNOSIS — I10 Essential (primary) hypertension: Secondary | ICD-10-CM

## 2022-06-02 DIAGNOSIS — R350 Frequency of micturition: Secondary | ICD-10-CM | POA: Diagnosis not present

## 2022-06-02 DIAGNOSIS — Z1211 Encounter for screening for malignant neoplasm of colon: Secondary | ICD-10-CM | POA: Diagnosis not present

## 2022-06-02 DIAGNOSIS — Z8542 Personal history of malignant neoplasm of other parts of uterus: Secondary | ICD-10-CM

## 2022-06-02 MED ORDER — SOLIFENACIN SUCCINATE 5 MG PO TABS
ORAL_TABLET | ORAL | 4 refills | Status: DC
Start: 2022-06-02 — End: 2023-06-22

## 2022-06-02 NOTE — Progress Notes (Signed)
56 y.o. [id number] Married White or Caucasian female here for annual exam.  Doing well.  Denies vaginal bleeding.  Saw Dr. Delsa Sale in April.  Being seeing once a year by gyn/oncology.  With pathology had [place] with hypermethylation of MLH1 present therefore Lynch Syndrome testing not ordered.   Doing well.  Continuing to work on weight loss.  On Wegovy.    Did cologuard in 2020.  Now with significant weight loss and h/o endometrial cancer, feel should do colonoscopy this year.  Patient's last menstrual period was 07/10/2020.          Sexually active: Yes.    The current method of family planning is status post hysterectomy.    Smoker:  no  Health Maintenance: Pap:  05/22/2020 History of abnormal Pap:  no MMG:  01/2021 Colonoscopy:  cologuard 2020 BMD:   not indicated Screening Labs: done with work   reports that she has never smoked. She has never used smokeless tobacco. She reports that she does not drink alcohol and does not use drugs.  Past Medical History:  Diagnosis Date   Arthritis    Asthma    seasonal    Bladder incontinence    cant hold urine   Claustrophobia    Complication of anesthesia    facial itching,"hot spot"   Endometrial cancer, grade I ([place]) 06/2020   GERD (gastroesophageal reflux disease)    History of kidney stones    Hypertension    Lyme disease 11/2012   Sleep apnea    cpap   Spinal stenosis     Past Surgical History:  Procedure Laterality Date   CESAREAN SECTION     INSERTION OF MESH N/A 12/06/2017   Procedure: INSERTION OF MESH;  Surgeon: Ralene Ok, MD;  Location: [facility];  Service: General;  Laterality: N/A;   LITHOTRIPSY Right 1998   LYSIS OF ADHESION N/A 07/21/2020   Procedure: LYSIS OF ADHESIONS;  Surgeon: Everitt Amber, MD;  [facility];  Service: Gynecology;  Laterality: N/A;   REPEAT CESAREAN SECTION     ROBOTIC ASSISTED TOTAL HYSTERECTOMY WITH BILATERAL SALPINGO OOPHERECTOMY Bilateral 07/21/2020   Procedure: XI ROBOTIC  ASSISTED TOTAL HYSTERECTOMY WITH BILATERAL SALPINGO OOPHORECTOMY, MINI LAPARATOMY;  Surgeon: Everitt Amber, MD;  [facility];  Service: Gynecology;  Laterality: Bilateral;  GREATER THAN 250 GRAMS   SENTINEL NODE BIOPSY N/A 07/21/2020   Procedure: SENTINEL LYMPH  NODE BIOPSY;  Surgeon: Everitt Amber, MD;  [facility];  Service: Gynecology;  Laterality: N/A;   UMBILICAL HERNIA REPAIR N/A 12/06/2017   Procedure: LAPAROSCOPIC UMBILICAL HERNIA;  Surgeon: Ralene Ok, MD;  Location: [facility];  Service: General;  Laterality: N/A;    Current Outpatient Medications  Medication Sig Dispense Refill   acetaminophen (TYLENOL) 500 MG tablet Take 1,000 mg by mouth every 6 (six) hours as needed for moderate pain.     Benzonatate (TESSALON PERLES PO) Tessalon Perles     cetirizine (ZYRTEC) 10 MG tablet Take 5 mg by mouth daily as needed for allergies.     cholecalciferol (VITAMIN D3) 25 MCG (1000 UNIT) tablet Take 1,000 Units by mouth daily.     diclofenac sodium (VOLTAREN) 1 % GEL Apply 2 g topically 4 (four) times daily as needed (pain).     ibuprofen (ADVIL) 200 MG tablet Take 400 mg by mouth every 6 (six) hours as needed.     losartan-hydrochlorothiazide (HYZAAR) 100-25 MG tablet Take 1 tablet by mouth daily.     naproxen (NAPROSYN) 500 MG  tablet Take 500 mg by mouth daily.     ondansetron (ZOFRAN) 4 MG tablet Take 4 mg by mouth every 8 (eight) hours as needed for nausea or vomiting.      PROAIR HFA 108 (90 Base) MCG/ACT inhaler Inhale 1-2 puffs into the lungs every 6 (six) hours as needed for wheezing or shortness of breath.  0   Semaglutide-Weight Management (WEGOVY) 2.4 MG/0.75ML SOAJ Inject 2.4 mg into the skin once a week.     solifenacin (VESICARE) 5 MG tablet TAKE 1 TABLET (5 MG TOTAL) BY MOUTH DAILY. ONE BY MOUTH EVERY DAY 90 tablet 4   vitamin B-12 (CYANOCOBALAMIN) 100 MCG tablet Take 100 mcg by mouth daily.     No current facility-administered medications for this visit.    Family  History  Problem Relation Age of Onset   Liver disease Mother        nonalcoholic fatty liver disease   Hepatitis C Mother    Hypertension Mother    Hyperthyroidism Mother    Cancer Father        lung cancer   Hypothyroidism Father    ALS Sister    Mental illness Sister    Breast cancer Maternal Grandmother     ROS: Constitutional: negative Genitourinary:negative  Exam:   BP 116/88 (BP Location: Left Arm, Patient Position: Sitting, Cuff Size: Large)   Pulse 99   Ht _0  (1.676 m) Comment: Reported  Wt 261 lb 12.8 oz (118.8 kg)   LMP 07/10/2020   BMI 42.26 kg/m   Height: _1  (167.6 cm) (Reported)  General appearance: alert, cooperative and appears stated age Head: Normocephalic, without obvious abnormality, atraumatic Neck: no adenopathy, supple, symmetrical, trachea midline and thyroid normal to inspection and palpation Lungs: clear to auscultation bilaterally Breasts: normal appearance, no masses or tenderness Heart: regular rate and rhythm Abdomen: soft, non-tender; bowel sounds normal; no masses,  no organomegaly Extremities: extremities normal, atraumatic, no cyanosis or edema Skin: Skin color, texture, turgor normal. No rashes or lesions Lymph nodes: Cervical, supraclavicular, and axillary nodes normal. No abnormal inguinal nodes palpated Neurologic: Grossly normal   Pelvic: External genitalia:  no lesions              Urethra:  normal appearing urethra with no masses, tenderness or lesions              Bartholins and Skenes: normal                 Vagina: normal appearing vagina with normal color and no discharge, no lesions              Cervix: absent              Pap taken: No. Bimanual Exam:  Uterus:  uterus absent              Adnexa: no mass, fullness, tenderness               Rectovaginal: Confirms               Anus:  normal sphincter tone, no lesions  Chaperone, Octaviano Batty, CMA, was present for exam.  Assessment/Plan: 1. Well woman exam with  routine gynecological exam - Pap smear not indicated - Mammogram up to date - Colonoscopy referral placed - Bone mineral density not indicated - lab work done with work - vaccines reviewed/updated.  Has not done shingrix vaccination.    2. Urinary frequency - solifenacin (VESICARE) 5 MG tablet;  TAKE 1 TABLET (5 MG TOTAL) BY MOUTH DAILY. ONE BY MOUTH EVERY DAY  Dispense: 90 tablet; Refill: 4  3. Colon cancer screening - Ambulatory referral to Gastroenterology  4. History of endometrial cancer - followed by Gyn Onc yearly as well.  Pt will call after the holidays for appt.  5. Urinary incontinence, mixed  6. Essential hypertension - on losartin/HCTZ

## 2022-06-08 DIAGNOSIS — E668 Other obesity: Secondary | ICD-10-CM | POA: Diagnosis not present

## 2022-06-08 DIAGNOSIS — I10 Essential (primary) hypertension: Secondary | ICD-10-CM | POA: Diagnosis not present

## 2022-06-08 DIAGNOSIS — Z6841 Body Mass Index (BMI) 40.0 and over, adult: Secondary | ICD-10-CM | POA: Diagnosis not present

## 2022-06-13 ENCOUNTER — Other Ambulatory Visit (HOSPITAL_BASED_OUTPATIENT_CLINIC_OR_DEPARTMENT_OTHER): Payer: Self-pay | Admitting: Obstetrics & Gynecology

## 2022-06-13 DIAGNOSIS — R350 Frequency of micturition: Secondary | ICD-10-CM

## 2022-07-04 DIAGNOSIS — Z6841 Body Mass Index (BMI) 40.0 and over, adult: Secondary | ICD-10-CM | POA: Diagnosis not present

## 2022-07-04 DIAGNOSIS — E668 Other obesity: Secondary | ICD-10-CM | POA: Diagnosis not present

## 2022-08-17 DIAGNOSIS — I10 Essential (primary) hypertension: Secondary | ICD-10-CM | POA: Diagnosis not present

## 2022-08-17 DIAGNOSIS — J45909 Unspecified asthma, uncomplicated: Secondary | ICD-10-CM | POA: Diagnosis not present

## 2022-08-17 DIAGNOSIS — G4733 Obstructive sleep apnea (adult) (pediatric): Secondary | ICD-10-CM | POA: Diagnosis not present

## 2022-08-17 DIAGNOSIS — E559 Vitamin D deficiency, unspecified: Secondary | ICD-10-CM | POA: Diagnosis not present

## 2022-09-02 DIAGNOSIS — Z79899 Other long term (current) drug therapy: Secondary | ICD-10-CM | POA: Diagnosis not present

## 2022-09-02 DIAGNOSIS — R109 Unspecified abdominal pain: Secondary | ICD-10-CM | POA: Diagnosis not present

## 2022-09-02 DIAGNOSIS — R112 Nausea with vomiting, unspecified: Secondary | ICD-10-CM | POA: Diagnosis not present

## 2022-09-02 DIAGNOSIS — Z885 Allergy status to narcotic agent status: Secondary | ICD-10-CM | POA: Diagnosis not present

## 2022-09-02 DIAGNOSIS — I10 Essential (primary) hypertension: Secondary | ICD-10-CM | POA: Diagnosis not present

## 2022-09-02 DIAGNOSIS — Z880 Allergy status to penicillin: Secondary | ICD-10-CM | POA: Diagnosis not present

## 2022-09-02 DIAGNOSIS — K828 Other specified diseases of gallbladder: Secondary | ICD-10-CM | POA: Diagnosis not present

## 2022-09-02 DIAGNOSIS — K853 Drug induced acute pancreatitis without necrosis or infection: Secondary | ICD-10-CM | POA: Diagnosis not present

## 2022-09-02 DIAGNOSIS — Z888 Allergy status to other drugs, medicaments and biological substances status: Secondary | ICD-10-CM | POA: Diagnosis not present

## 2022-09-02 DIAGNOSIS — R1011 Right upper quadrant pain: Secondary | ICD-10-CM | POA: Diagnosis not present

## 2022-09-02 DIAGNOSIS — K668 Other specified disorders of peritoneum: Secondary | ICD-10-CM | POA: Diagnosis not present

## 2022-09-02 DIAGNOSIS — R509 Fever, unspecified: Secondary | ICD-10-CM | POA: Diagnosis not present

## 2022-10-18 DIAGNOSIS — Z760 Encounter for issue of repeat prescription: Secondary | ICD-10-CM | POA: Diagnosis not present

## 2022-10-18 DIAGNOSIS — Z6841 Body Mass Index (BMI) 40.0 and over, adult: Secondary | ICD-10-CM | POA: Diagnosis not present

## 2022-10-18 DIAGNOSIS — E668 Other obesity: Secondary | ICD-10-CM | POA: Diagnosis not present

## 2022-10-18 DIAGNOSIS — I10 Essential (primary) hypertension: Secondary | ICD-10-CM | POA: Diagnosis not present

## 2022-11-02 ENCOUNTER — Inpatient Hospital Stay: Payer: BC Managed Care – PPO | Attending: Obstetrics & Gynecology | Admitting: Obstetrics & Gynecology

## 2022-11-02 ENCOUNTER — Telehealth: Payer: Self-pay | Admitting: *Deleted

## 2022-11-02 ENCOUNTER — Other Ambulatory Visit: Payer: Self-pay

## 2022-11-02 VITALS — BP 128/81 | HR 86 | Temp 97.6°F | Resp 16 | Ht 66.0 in | Wt 257.3 lb

## 2022-11-02 DIAGNOSIS — Z90710 Acquired absence of both cervix and uterus: Secondary | ICD-10-CM | POA: Insufficient documentation

## 2022-11-02 DIAGNOSIS — Z8542 Personal history of malignant neoplasm of other parts of uterus: Secondary | ICD-10-CM | POA: Diagnosis not present

## 2022-11-02 DIAGNOSIS — C541 Malignant neoplasm of endometrium: Secondary | ICD-10-CM

## 2022-11-02 DIAGNOSIS — Z90722 Acquired absence of ovaries, bilateral: Secondary | ICD-10-CM | POA: Diagnosis not present

## 2022-11-02 NOTE — Telephone Encounter (Signed)
Error

## 2022-11-02 NOTE — Patient Instructions (Signed)
Return in 1 year ?

## 2022-11-02 NOTE — Progress Notes (Signed)
Follow Up Note: Gyn-Onc  Katelyn Cohen 57 y.o. female  CC: She presents for a f/u visit   HPI: The oncology history was reviewed.  Interval History: She denies any vaginal bleeding, abdominal/pelvic pain, cough, lethargy or increasing abdominal girth.  Recently seen by Dr. Sabra Heck in follow-up.  Her exam was unremarkable.    Review of Systems  Review of Systems  Constitutional:  Negative for malaise/fatigue and weight loss.  Respiratory:  Negative for shortness of breath and wheezing.   Cardiovascular:  Negative for chest pain and leg swelling.  Gastrointestinal:  Negative for abdominal pain, blood in stool, constipation, nausea and vomiting.  Genitourinary:  Negative for dysuria, frequency, hematuria and urgency.  Musculoskeletal:  Negative for joint pain and myalgias.  Neurological:  Negative for weakness.  Psychiatric/Behavioral:  Negative for depression. The patient does not have insomnia.    Current medications, allergy, social history, past surgical history, past medical history, family history were all reviewed.    Vitals:  BP 128/81 (BP Location: Left Arm, Patient Position: Sitting)   Pulse 86   Temp 97.6 F (36.4 C) (Oral)   Resp 16   Ht 5\' 6"  (1.676 m)   Wt 257 lb 4.4 oz (116.7 kg)   LMP 07/10/2020   SpO2 100%   BMI 41.53 kg/m    Physical Exam Exam conducted with a chaperone present.  Constitutional:      General: She is not in acute distress. Cardiovascular:     Rate and Rhythm: Normal rate and regular rhythm.  Pulmonary:     Effort: Pulmonary effort is normal.     Breath sounds: Normal breath sounds. No wheezing or rhonchi.  Abdominal:     Palpations: Abdomen is soft.     Tenderness: There is no abdominal tenderness. There is no right CVA tenderness or left CVA tenderness.     Hernia: No hernia is present.  Genitourinary:    General: Normal vulva.     Urethra: No urethral lesion.     Vagina: No lesions. No bleeding Musculoskeletal:     Cervical  back: Neck supple.     Right lower leg: No edema.     Left lower leg: No edema.  Lymphadenopathy:     Upper Body:     Right upper body: No supraclavicular adenopathy.     Left upper body: No supraclavicular adenopathy.     Lower Body: No right inguinal adenopathy. No left inguinal adenopathy.  Skin:    Findings: No rash.  Neurological:     Mental Status: She is oriented to person, place, and time.   Assessment/Plan:  Endometrial cancer [facility]) 57 y.o.  year old P2 with a history of stage IA grade 1 endometrial cancer (MMRd) s/p robotic hysterectomy and staging on 07/21/20.  Negative symptom review, normal exam.  No evidence of recurrence    >continue follow-up every 6 months for 5 years in accordance with NCCN guidelines.     I personally spent 25 minutes face-to-face and non-face-to-face in the care of this patient, which includes all pre, intra, and post visit time on the date of service.   Katelyn Crocker, MD

## 2022-11-02 NOTE — Assessment & Plan Note (Addendum)
57 y.o.  year old P2 with a history of stage IA grade 1 endometrial cancer (MMRd) s/p robotic hysterectomy and staging on 07/21/20.  Negative symptom review, normal exam.  No evidence of recurrence    >continue follow-up every 6 months for 5 years in accordance with NCCN guidelines.

## 2022-11-14 ENCOUNTER — Other Ambulatory Visit: Payer: Self-pay | Admitting: Family Medicine

## 2022-11-14 DIAGNOSIS — Z1231 Encounter for screening mammogram for malignant neoplasm of breast: Secondary | ICD-10-CM

## 2022-11-15 ENCOUNTER — Ambulatory Visit
Admission: RE | Admit: 2022-11-15 | Discharge: 2022-11-15 | Disposition: A | Discharge status: Home / Self Care | Payer: BC Managed Care – PPO | Source: Ambulatory Visit | Attending: Family Medicine | Admitting: Family Medicine

## 2022-11-15 DIAGNOSIS — Z1231 Encounter for screening mammogram for malignant neoplasm of breast: Secondary | ICD-10-CM | POA: Diagnosis not present

## 2022-12-07 ENCOUNTER — Encounter: Payer: Self-pay | Admitting: Internal Medicine

## 2022-12-08 DIAGNOSIS — R4589 Other symptoms and signs involving emotional state: Secondary | ICD-10-CM | POA: Diagnosis not present

## 2022-12-08 DIAGNOSIS — Z6841 Body Mass Index (BMI) 40.0 and over, adult: Secondary | ICD-10-CM | POA: Diagnosis not present

## 2022-12-08 DIAGNOSIS — Z760 Encounter for issue of repeat prescription: Secondary | ICD-10-CM | POA: Diagnosis not present

## 2022-12-08 DIAGNOSIS — E668 Other obesity: Secondary | ICD-10-CM | POA: Diagnosis not present

## 2022-12-22 ENCOUNTER — Ambulatory Visit (AMBULATORY_SURGERY_CENTER): Payer: BC Managed Care – PPO

## 2022-12-22 VITALS — Ht 66.0 in | Wt 255.0 lb

## 2022-12-22 DIAGNOSIS — Z1211 Encounter for screening for malignant neoplasm of colon: Secondary | ICD-10-CM

## 2022-12-22 MED ORDER — NA SULFATE-K SULFATE-MG SULF 17.5-3.13-1.6 GM/177ML PO SOLN: 1.0000 | Freq: Once | ORAL | 0 refills | Status: AC

## 2022-12-22 NOTE — Progress Notes (Signed)
No egg or soy allergy known to patient  No issues known to pt with past sedation with any surgeries or procedures - facial itching and hot spot  Patient denies ever being told they had issues or difficulty with intubation  No FH of Malignant Hyperthermia Pt is not on diet pills Pt is not on  home 02  Pt is not on blood thinners  Pt reports drug induced constipation with wegovy instructions given for extra miralax 7 days prior to procedure  No A fib or A flutter Have any cardiac testing pending--no   Patient's chart reviewed by Cathlyn Parsons CNRA prior to previsit and patient appropriate for the [facility].  Previsit completed and red dot placed by patient's name on their procedure day (on provider's schedule).     PV completed. Prep instructions sent to home address and MyChart. Goodrx coupon for [facility] provided  Pt instructed to use Single[employer]com or GoodRx for a price reduction on prep

## 2022-12-23 ENCOUNTER — Encounter: Payer: Self-pay | Admitting: Internal Medicine

## 2023-01-11 ENCOUNTER — Encounter: Payer: Self-pay | Admitting: Internal Medicine

## 2023-01-11 ENCOUNTER — Ambulatory Visit (AMBULATORY_SURGERY_CENTER): Payer: BC Managed Care – PPO | Admitting: Internal Medicine

## 2023-01-11 VITALS — BP 119/78 | HR 78 | Temp 98.4°F | Resp 13 | Ht 65.0 in | Wt 255.0 lb

## 2023-01-11 DIAGNOSIS — D122 Benign neoplasm of ascending colon: Secondary | ICD-10-CM

## 2023-01-11 DIAGNOSIS — K635 Polyp of colon: Secondary | ICD-10-CM | POA: Diagnosis not present

## 2023-01-11 DIAGNOSIS — Z1211 Encounter for screening for malignant neoplasm of colon: Secondary | ICD-10-CM | POA: Diagnosis not present

## 2023-01-11 DIAGNOSIS — D120 Benign neoplasm of cecum: Secondary | ICD-10-CM | POA: Diagnosis not present

## 2023-01-11 DIAGNOSIS — D124 Benign neoplasm of descending colon: Secondary | ICD-10-CM

## 2023-01-11 MED ORDER — SODIUM CHLORIDE 0.9 % IV SOLN: 500.0000 mL | Freq: Once | INTRAVENOUS | Status: DC

## 2023-01-11 NOTE — Progress Notes (Signed)
Called to room to assist during endoscopic procedure.  Patient ID and intended procedure confirmed with present staff. Received instructions for my participation in the procedure from the performing physician.  

## 2023-01-11 NOTE — Op Note (Signed)
[facility] Patient Name: Katelyn Cohen Procedure Date: 01/11/2023 2:29 PM MRN: [id number] Endoscopist: Wilhemina Bonito. Marina Goodell , MD, [id number] Age: 57 Referring MD:  Date of Birth: August 18, 1965 Gender: Female Account #: [id number] Procedure:                Colonoscopy with cold snare polypectomy x 4 Indications:              Screening for colorectal malignant neoplasm Medicines:                Monitored Anesthesia Care Procedure:                Pre-Anesthesia Assessment:                           - Prior to the procedure, a History and Physical                            was performed, and patient medications and                            allergies were reviewed. The patient's tolerance of                            previous anesthesia was also reviewed. The risks                            and benefits of the procedure and the sedation                            options and risks were discussed with the patient.                            All questions were answered, and informed consent                            was obtained. Prior Anticoagulants: The patient has                            taken no anticoagulant or antiplatelet agents. ASA                            Grade Assessment: II - A patient with mild systemic                            disease. After reviewing the risks and benefits,                            the patient was deemed in satisfactory condition to                            undergo the procedure.                           After obtaining informed consent, the colonoscope  was passed under direct vision. Throughout the                            procedure, the patient's blood pressure, pulse, and                            oxygen saturations were monitored continuously. The                            CF [id number] #[id number] was introduced through the anus                            and advanced to the the cecum, identified by                             appendiceal orifice and ileocecal valve. The                            ileocecal valve, appendiceal orifice, and rectum                            were photographed. The quality of the bowel                            preparation was excellent. The colonoscopy was                            performed without difficulty. The patient tolerated                            the procedure well. The bowel preparation used was                            SUPREP via split dose instruction. Scope In: 2:50:10 PM Scope Out: 3:06:49 PM Scope Withdrawal Time: 0 hours 12 minutes 53 seconds  Total Procedure Duration: 0 hours 16 minutes 39 seconds  Findings:                 Four polyps were found in the descending colon,                            ascending colon and cecum. The polyps were 1 to 3                            mm in size. These polyps were removed with a cold                            snare. Resection and retrieval were complete.                           The exam was otherwise without abnormality on                            direct and  retroflexion views. Internal hemorrhoids                            present Complications:            No immediate complications. Estimated blood loss:                            None. Estimated Blood Loss:     Estimated blood loss: none. Impression:               - Four 1 to 3 mm polyps in the descending colon, in                            the ascending colon and in the cecum, removed with                            a cold snare. Resected and retrieved.                           - The examination was otherwise normal on direct                            and retroflexion views. Internal hemorrhoids Recommendation:           - Repeat colonoscopy in 5-10 years for surveillance.                           - Patient has a contact number available for                            emergencies. The signs and symptoms of potential                             delayed complications were discussed with the                            patient. Return to normal activities tomorrow.                            Written discharge instructions were provided to the                            patient.                           - Resume previous diet.                           - Continue present medications.                           - Await pathology results. Wilhemina Bonito. Marina Goodell, MD 01/11/2023 3:11:50 PM This report has been signed electronically.

## 2023-01-11 NOTE — Progress Notes (Signed)
A/ox3, pleased with MAC, report to RN 

## 2023-01-11 NOTE — Patient Instructions (Signed)
   Handouts on polyps & hemorrhoids given to you today  Await pathology results on polyps removed    YOU HAD AN ENDOSCOPIC PROCEDURE TODAY AT THE [facility]:   Refer to the procedure report that was given to you for any specific questions about what was found during the examination.  If the procedure report does not answer your questions, please call your gastroenterologist to clarify.  If you requested that your care partner not be given the details of your procedure findings, then the procedure report has been included in a sealed envelope for you to review at your convenience later.  YOU SHOULD EXPECT: Some feelings of bloating in the abdomen. Passage of more gas than usual.  Walking can help get rid of the air that was put into your GI tract during the procedure and reduce the bloating. If you had a lower endoscopy (such as a colonoscopy or flexible sigmoidoscopy) you may notice spotting of blood in your stool or on the toilet paper. If you underwent a bowel prep for your procedure, you may not have a normal bowel movement for a few days.  Please Note:  You might notice some irritation and congestion in your nose or some drainage.  This is from the oxygen used during your procedure.  There is no need for concern and it should clear up in a day or so.  SYMPTOMS TO REPORT IMMEDIATELY:  Following lower endoscopy (colonoscopy or flexible sigmoidoscopy):  Excessive amounts of blood in the stool  Significant tenderness or worsening of abdominal pains  Swelling of the abdomen that is new, acute  Fever of 100F or higher   For urgent or emergent issues, a gastroenterologist can be reached at any hour by calling [phone]. Do not use MyChart messaging for urgent concerns.    DIET:  We do recommend a small meal at first, but then you may proceed to your regular diet.  Drink plenty of fluids but you should avoid alcoholic beverages for 24 hours.  ACTIVITY:  You should plan to  take it easy for the rest of today and you should NOT DRIVE or use heavy machinery until tomorrow (because of the sedation medicines used during the test).    FOLLOW UP: Our staff will call the number listed on your records the next business day following your procedure.  We will call around 7:15- 8:00 am to check on you and address any questions or concerns that you may have regarding the information given to you following your procedure. If we do not reach you, we will leave a message.     If any biopsies were taken you will be contacted by phone or by letter within the next 1-3 weeks.  Please call us at [phone] if you have not heard about the biopsies in 3 weeks.    SIGNATURES/CONFIDENTIALITY: You and/or your care partner have signed paperwork which will be entered into your electronic medical record.  These signatures attest to the fact that that the information above on your After Visit Summary has been reviewed and is understood.  Full responsibility of the confidentiality of this discharge information lies with you and/or your care-partner. 

## 2023-01-11 NOTE — Progress Notes (Signed)
HISTORY OF PRESENT ILLNESS:  Katelyn Cohen is a 57 y.o. female who is sent for screening colonoscopy.  No complaints.  REVIEW OF SYSTEMS:  All non-GI ROS negative except for  Past Medical History:  Diagnosis Date   Arthritis    Asthma    seasonal    Bladder incontinence    cant hold urine   Claustrophobia    Complication of anesthesia    facial itching,"hot spot"   Endometrial cancer, grade I (HCC) 06/2020   GERD (gastroesophageal reflux disease)    History of kidney stones    Hypertension    Lyme disease 11/2012   Sleep apnea    cpap   Spinal stenosis     Past Surgical History:  Procedure Laterality Date   CESAREAN SECTION     INSERTION OF MESH N/A 12/06/2017   Procedure: INSERTION OF MESH;  Surgeon: Axel Filler, MD;  Location: [facility] OR;  Service: General;  Laterality: N/A;   LITHOTRIPSY Right 1998   LYSIS OF ADHESION N/A 07/21/2020   Procedure: LYSIS OF ADHESIONS;  Surgeon: Adolphus Birchwood, MD;  Location: [facility] ORS;  Service: Gynecology;  Laterality: N/A;   REPEAT CESAREAN SECTION     ROBOTIC ASSISTED TOTAL HYSTERECTOMY WITH BILATERAL SALPINGO OOPHERECTOMY Bilateral 07/21/2020   Procedure: XI ROBOTIC ASSISTED TOTAL HYSTERECTOMY WITH BILATERAL SALPINGO OOPHORECTOMY, MINI LAPARATOMY;  Surgeon: Adolphus Birchwood, MD;  Location: [facility] ORS;  Service: Gynecology;  Laterality: Bilateral;  GREATER THAN 250 GRAMS   SENTINEL NODE BIOPSY N/A 07/21/2020   Procedure: SENTINEL LYMPH  NODE BIOPSY;  Surgeon: Adolphus Birchwood, MD;  Location: [facility] ORS;  Service: Gynecology;  Laterality: N/A;   UMBILICAL HERNIA REPAIR N/A 12/06/2017   Procedure: LAPAROSCOPIC UMBILICAL HERNIA;  Surgeon: Axel Filler, MD;  Location: [facility] OR;  Service: General;  Laterality: N/A;    Social History Katelyn Cohen  reports that she has never smoked. She has never used smokeless tobacco. She reports that she does not drink alcohol and does not use drugs.  family history includes ALS in her sister; Breast cancer in her maternal  grandmother; Cancer in her father; Hepatitis C in her mother; Hypertension in her mother; Hyperthyroidism in her mother; Hypothyroidism in her father; Liver disease in her mother; Mental illness in her sister.  Allergies  Allergen Reactions   Ace Inhibitors Cough   Penicillins Itching and Swelling    PATIENT HAS HAD A PCN REACTION WITH IMMEDIATE RASH, FACIAL/TONGUE/THROAT SWELLING, SOB, OR LIGHTHEADEDNESS WITH HYPOTENSION:  NO  Has patient had a PCN reaction causing severe rash involving mucus membranes or skin necrosis: No Has patient had a PCN reaction that required hospitalization: No Patient has had a PCN reaction within the last 10 years.  No    Codeine Other (See Comments)    Other reaction(s): mouth itching   Hydrocodone-Acetaminophen Itching    Other Reaction(s): Dizziness, GI Intolerance   Hydrocodone Itching    Face        PHYSICAL EXAMINATION: Vital signs: BP (!) 105/55 (BP Location: Right Arm, Patient Position: Sitting, Cuff Size: Normal)   Pulse 76   Temp 98.4 F (36.9 C) (Temporal)   Ht 5\' 5"  (1.651 m)   Wt 255 lb (115.7 kg)   LMP 07/10/2020   SpO2 99%   BMI 42.43 kg/m  General: Well-developed, well-nourished, no acute distress HEENT: Sclerae are anicteric, conjunctiva pink. Oral mucosa intact Lungs: Clear Heart: Regular Abdomen: soft, nontender, nondistended, no obvious ascites, no peritoneal signs, normal bowel sounds. No organomegaly. Extremities: No edema Psychiatric:  alert and oriented x3. Cooperative     ASSESSMENT:  Colon cancer screening   PLAN:   Screening colonoscopy

## 2023-01-11 NOTE — Progress Notes (Signed)
Vitals-DT  Pt's states no medical or surgical changes since previsit or office visit.  

## 2023-01-12 ENCOUNTER — Telehealth: Payer: Self-pay

## 2023-01-12 NOTE — Telephone Encounter (Signed)
  Follow up Call-     01/11/2023    1:37 PM 01/11/2023    1:34 PM  Call back number  Post procedure Call Back phone  # [phone]   Permission to leave phone message  Yes     Patient questions:  Do you have a fever, pain , or abdominal swelling? No. Pain Score  0 *  Have you tolerated food without any problems? Yes.    Have you been able to return to your normal activities? Yes.    Do you have any questions about your discharge instructions: Diet   No. Medications  No. Follow up visit  No.  Do you have questions or concerns about your Care? No.  Actions: * If pain score is 4 or above: No action needed, pain <4.

## 2023-01-24 ENCOUNTER — Encounter: Payer: Self-pay | Admitting: Internal Medicine

## 2023-06-21 ENCOUNTER — Other Ambulatory Visit (HOSPITAL_BASED_OUTPATIENT_CLINIC_OR_DEPARTMENT_OTHER): Payer: Self-pay | Admitting: Obstetrics & Gynecology

## 2023-06-21 DIAGNOSIS — R350 Frequency of micturition: Secondary | ICD-10-CM

## 2023-06-22 ENCOUNTER — Ambulatory Visit (HOSPITAL_BASED_OUTPATIENT_CLINIC_OR_DEPARTMENT_OTHER): Payer: BC Managed Care – PPO | Admitting: Obstetrics & Gynecology

## 2023-06-22 ENCOUNTER — Encounter (HOSPITAL_BASED_OUTPATIENT_CLINIC_OR_DEPARTMENT_OTHER): Payer: Self-pay | Admitting: Obstetrics & Gynecology

## 2023-06-22 VITALS — BP 125/86 | HR 76 | Ht 65.25 in | Wt 264.0 lb

## 2023-06-22 DIAGNOSIS — I10 Essential (primary) hypertension: Secondary | ICD-10-CM

## 2023-06-22 DIAGNOSIS — R350 Frequency of micturition: Secondary | ICD-10-CM | POA: Diagnosis not present

## 2023-06-22 DIAGNOSIS — R200 Anesthesia of skin: Secondary | ICD-10-CM

## 2023-06-22 DIAGNOSIS — Z01419 Encounter for gynecological examination (general) (routine) without abnormal findings: Secondary | ICD-10-CM | POA: Diagnosis not present

## 2023-06-22 DIAGNOSIS — C541 Malignant neoplasm of endometrium: Secondary | ICD-10-CM

## 2023-06-22 MED ORDER — SOLIFENACIN SUCCINATE 5 MG PO TABS
ORAL_TABLET | ORAL | 4 refills | Status: DC
Start: 2023-06-22 — End: 2024-06-15

## 2023-06-22 NOTE — Progress Notes (Signed)
57 y.o. Katelyn Cohen Married White or Caucasian female here for annual exam.  Denies vaginal bleeding.  H/o endometrial cancer with robotic TLH/BSO/SNB 07/2020.    Unrelated, she has a place on her back that if it is touched will cause her entire arm to go numb.  She doesn't know exactly where to start with an evaluation.  Patient's last menstrual period was 07/10/2020.          Sexually active: Yes.    The current method of family planning is status post hysterectomy.     Health Maintenance: Pap:  05/22/2020 Negative History of abnormal Pap:  no MMG:  11/15/2022 Negative Colonoscopy:  01/11/2023, follow up 7 years BMD:   guidelines reviewed Screening Labs: did through her work in October   reports that she has never smoked. She has never used smokeless tobacco. She reports that she does not drink alcohol and does not use drugs.  Past Medical History:  Diagnosis Date   Arthritis    Asthma    seasonal    Bladder incontinence    cant hold urine   Claustrophobia    Complication of anesthesia    facial itching,"hot spot"   Endometrial cancer, grade I (HCC) 06/2020   GERD (gastroesophageal reflux disease)    History of kidney stones    Hypertension    Lyme disease 11/2012   Sleep apnea    cpap   Spinal stenosis     Past Surgical History:  Procedure Laterality Date   CESAREAN SECTION     INSERTION OF MESH N/A 12/06/2017   Procedure: INSERTION OF MESH;  Surgeon: Axel Filler, MD;  Location: [facility] OR;  Service: General;  Laterality: N/A;   LITHOTRIPSY Right 1998   LYSIS OF ADHESION N/A 07/21/2020   Procedure: LYSIS OF ADHESIONS;  Surgeon: Adolphus Birchwood, MD;  Location: [facility] ORS;  Service: Gynecology;  Laterality: N/A;   REPEAT CESAREAN SECTION     ROBOTIC ASSISTED TOTAL HYSTERECTOMY WITH BILATERAL SALPINGO OOPHERECTOMY Bilateral 07/21/2020   Procedure: XI ROBOTIC ASSISTED TOTAL HYSTERECTOMY WITH BILATERAL SALPINGO OOPHORECTOMY, MINI LAPARATOMY;  Surgeon: Adolphus Birchwood, MD;  Location: [facility]  ORS;  Service: Gynecology;  Laterality: Bilateral;  GREATER THAN 250 GRAMS   SENTINEL NODE BIOPSY N/A 07/21/2020   Procedure: SENTINEL LYMPH  NODE BIOPSY;  Surgeon: Adolphus Birchwood, MD;  Location: [facility] ORS;  Service: Gynecology;  Laterality: N/A;   UMBILICAL HERNIA REPAIR N/A 12/06/2017   Procedure: LAPAROSCOPIC UMBILICAL HERNIA;  Surgeon: Axel Filler, MD;  Location: [facility] OR;  Service: General;  Laterality: N/A;    Current Outpatient Medications  Medication Sig Dispense Refill   acetaminophen (TYLENOL) 500 MG tablet Take 1,000 mg by mouth every 6 (six) hours as needed for moderate pain.     Benzonatate (TESSALON PERLES PO) Tessalon Perles     cetirizine (ZYRTEC) 10 MG tablet Take 5 mg by mouth daily as needed for allergies.     cholecalciferol (VITAMIN D3) 25 MCG (1000 UNIT) tablet Take 1,000 Units by mouth daily.     diclofenac sodium (VOLTAREN) 1 % GEL Apply 2 g topically 4 (four) times daily as needed (pain).     fluticasone (FLONASE) 50 MCG/ACT nasal spray Place 1 spray into both nostrils as needed.     ibuprofen (ADVIL) 200 MG tablet Take 400 mg by mouth every 6 (six) hours as needed.     loratadine (CLARITIN) 10 MG tablet Take 10 mg by mouth as needed.     losartan-hydrochlorothiazide (HYZAAR) 100-25 MG tablet Take 1  tablet by mouth daily.     naproxen (NAPROSYN) 500 MG tablet Take 500 mg by mouth daily.     ondansetron (ZOFRAN) 4 MG tablet Take 4 mg by mouth every 8 (eight) hours as needed for nausea or vomiting.      PROAIR HFA 108 (90 Base) MCG/ACT inhaler Inhale 1-2 puffs into the lungs every 6 (six) hours as needed for wheezing or shortness of breath.  0   Semaglutide-Weight Management (WEGOVY) 2.4 MG/0.75ML SOAJ Inject 2.4 mg into the skin once a week.     solifenacin (VESICARE) 5 MG tablet TAKE 1 TABLET (5 MG TOTAL) BY MOUTH DAILY. ONE BY MOUTH EVERY DAY 90 tablet 4   vitamin B-12 (CYANOCOBALAMIN) 100 MCG tablet Take 100 mcg by mouth daily.     No current facility-administered  medications for this visit.    Family History  Problem Relation Age of Onset   Liver disease Mother        nonalcoholic fatty liver disease   Hepatitis C Mother    Hypertension Mother    Hyperthyroidism Mother    Cancer Father        lung cancer   Hypothyroidism Father    ALS Sister    Mental illness Sister    Breast cancer Maternal Grandmother    Colon cancer Neg Hx    Colon polyps Neg Hx    Esophageal cancer Neg Hx    Rectal cancer Neg Hx    Stomach cancer Neg Hx     ROS: Constitutional: negative Genitourinary:negative  Exam:   BP 125/86 (BP Location: Right Arm, Patient Position: Sitting, Cuff Size: Large)   Pulse 76   Ht 5' 5.25" (1.657 m)   Wt 264 lb (119.7 kg)   LMP 07/10/2020   BMI 43.60 kg/m   Height: 5' 5.25" (165.7 cm)  General appearance: alert, cooperative and appears stated age Head: Normocephalic, without obvious abnormality, atraumatic Neck: no adenopathy, supple, symmetrical, trachea midline and thyroid normal to inspection and palpation Lungs: clear to auscultation bilaterally Breasts: normal appearance, no masses or tenderness Heart: regular rate and rhythm Abdomen: soft, non-tender; bowel sounds normal; no masses,  no organomegaly Extremities: extremities normal, atraumatic, no cyanosis or edema Skin: Skin color, texture, turgor normal. No rashes or lesions Lymph nodes: Cervical, supraclavicular, and axillary nodes normal. No abnormal inguinal nodes palpated Neurologic: Grossly normal   Pelvic: External genitalia:  no lesions              Urethra:  normal appearing urethra with no masses, tenderness or lesions              Bartholins and Skenes: normal                 Vagina: normal appearing vagina with normal color and no discharge, no lesions              Cervix: absent              Pap taken: No. Bimanual Exam:  Uterus:  uterus absent              Adnexa: no mass, fullness, tenderness               Rectovaginal: Confirms                Anus:  normal sphincter tone, no lesions  Chaperone, Ina Homes, CMA, was present for exam.  Assessment/Plan: 1. Well woman exam with routine gynecological exam - Pap smear not  indicated - Mammogram 10/2022 - Colonoscopy 12/2022 - Bone mineral density guidelines reviewed - lab work done with work - vaccines reviewed/updated  2. Urinary frequency - on vesicare 5mg  daily.  Rx completed for 90 day supply/4RF  3. Endometrial cancer (HCC) - I followed every six month by gyn/onc alternating with this gyn exam  4. Essential hypertension - on  losartin/HCTZ

## 2023-06-27 DIAGNOSIS — Z1331 Encounter for screening for depression: Secondary | ICD-10-CM | POA: Diagnosis not present

## 2023-06-27 DIAGNOSIS — Z760 Encounter for issue of repeat prescription: Secondary | ICD-10-CM | POA: Diagnosis not present

## 2023-06-27 DIAGNOSIS — Z713 Dietary counseling and surveillance: Secondary | ICD-10-CM | POA: Diagnosis not present

## 2023-06-27 DIAGNOSIS — Z6841 Body Mass Index (BMI) 40.0 and over, adult: Secondary | ICD-10-CM | POA: Diagnosis not present

## 2023-06-27 DIAGNOSIS — Z7182 Exercise counseling: Secondary | ICD-10-CM | POA: Diagnosis not present

## 2023-06-27 DIAGNOSIS — E66813 Obesity, class 3: Secondary | ICD-10-CM | POA: Diagnosis not present

## 2023-06-27 DIAGNOSIS — I10 Essential (primary) hypertension: Secondary | ICD-10-CM | POA: Diagnosis not present

## 2023-07-06 NOTE — Progress Notes (Addendum)
Katelyn Cohen [facility] [address] [place] [address] Phone: [phone]   Assessment and Plan:     1. Right arm numbness 2. Neck pain -Chronic with exacerbation, initial sports medicine visit - Most consistent with cervical DDD leading to cervical radiculopathy based on HPI, physical exam.  Differential includes thoracic outlet syndrome versus peripheral neuropathy - Patient has had intermittent numbness and tingling radiating down her right arm for the past 1 year, with hypersensitivity to left thumb over a similar timeframe.  She states she has past medical history of cervical DDD and was receiving epidural steroid injections, however not in the past 2 to 3 years - I recommend restarting a conservative therapy course to see if we can decrease patient's symptoms, though ultimately I feel we will likely need a cervical MRI to reevaluate - Start meloxicam 15 mg daily x2 weeks.  If still having pain after 2 weeks, complete 3rd-week of NSAID. May use remaining NSAID as needed once daily for pain control.  Do not to use additional over-the-counter NSAIDs (ibuprofen, naproxen, Advil, Aleve) while taking prescription NSAIDs.  May use Tylenol [phone] mg 2 to 3 times a day for breakthrough pain. - Discussed trialing gabapentin, however patient has used this medication in the past and did not like the way it made her feel -Start HEP for neck and shoulders - X-ray obtained in clinic.  My interpretation: No acute fracture or dislocation.  Unusual appearance of spinous processes of C5 with caudal deviation.  Will await official radiology review   Pertinent previous records reviewed include OB/GYN office notes 06/22/2023  Follow Up: 4 weeks for reevaluation.  If no improvement or worsening of symptoms, could consider C-spine MRI-patient said she would require an open MRI due to claustrophobia.  Could also consider nerve conduction study   Subjective:   I,  Katelyn Cohen, am serving as a Neurosurgeon for Doctor Katelyn Cohen  Chief Complaint: right arm pain   HPI:   07/07/2023 Patient is a 57 year old female with right arm pain. Patient states pain is from shoulder to right arm. She gets dead arm / cramping sensation. She will have a twisting sensation/ motion . Isnt able to piliates movements due to pain. This has been going on a year plus. No MOI. Ibu for the pressure point. But not really pain more strangeness of the sensation. Hx of spinal stenosis . Right thumb becomes numb or hyper sensitive thinks this may have correlation to tender point.   Relevant Historical Information: Hypertension  Additional pertinent review of systems negative.   Current Outpatient Medications:    meloxicam (MOBIC) 15 MG tablet, Take 1 tablet (15 mg total) by mouth daily., Disp: 30 tablet, Rfl: 0   acetaminophen (TYLENOL) 500 MG tablet, Take 1,000 mg by mouth every 6 (six) hours as needed for moderate pain., Disp: , Rfl:    Benzonatate (TESSALON PERLES PO), Tessalon Perles, Disp: , Rfl:    cetirizine (ZYRTEC) 10 MG tablet, Take 5 mg by mouth daily as needed for allergies., Disp: , Rfl:    cholecalciferol (VITAMIN D3) 25 MCG (1000 UNIT) tablet, Take 1,000 Units by mouth daily., Disp: , Rfl:    diclofenac sodium (VOLTAREN) 1 % GEL, Apply 2 g topically 4 (four) times daily as needed (pain)., Disp: , Rfl:    fluticasone (FLONASE) 50 MCG/ACT nasal spray, Place 1 spray into both nostrils as needed., Disp: , Rfl:    ibuprofen (ADVIL) 200 MG tablet,  Take 400 mg by mouth every 6 (six) hours as needed., Disp: , Rfl:    loratadine (CLARITIN) 10 MG tablet, Take 10 mg by mouth as needed., Disp: , Rfl:    losartan-hydrochlorothiazide (HYZAAR) 100-25 MG tablet, Take 1 tablet by mouth daily., Disp: , Rfl:    naproxen (NAPROSYN) 500 MG tablet, Take 500 mg by mouth daily., Disp: , Rfl:    ondansetron (ZOFRAN) 4 MG tablet, Take 4 mg by mouth every 8 (eight) hours as needed for  nausea or vomiting. , Disp: , Rfl:    PROAIR HFA 108 (90 Base) MCG/ACT inhaler, Inhale 1-2 puffs into the lungs every 6 (six) hours as needed for wheezing or shortness of breath., Disp: , Rfl: 0   Semaglutide-Weight Management (WEGOVY) 2.4 MG/0.75ML SOAJ, Inject 2.4 mg into the skin once a week., Disp: , Rfl:    solifenacin (VESICARE) 5 MG tablet, TAKE 1 TABLET (5 MG TOTAL) BY MOUTH DAILY. ONE BY MOUTH EVERY DAY, Disp: 90 tablet, Rfl: 4   vitamin B-12 (CYANOCOBALAMIN) 100 MCG tablet, Take 100 mcg by mouth daily., Disp: , Rfl:    Objective:     Vitals:   07/07/23 1257  BP: 130/78  Pulse: 75  SpO2: 99%  Weight: 263 lb (119.3 kg)  Height: 5\' 5"  (1.651 m)      Body mass index is 43.77 kg/m.    Physical Exam:    Neck Exam: Cervical Spine- Posture normal Skin- normal, intact  Neuro:  Strength-  Right Left   Deltoid (C5) 5/5 5/5  Bicep/Brachioradialis (C5/6) 5/5  5/5  Wrist Extension (C6) 5/5 5/5  Tricep (C7) 5/5 5/5  Wrist Flexion (C7) 5/5 5/5  Grip (C8) 5/5 5/5  Finger Abduction (T1) 5/5 5/5   Sensation: intact to light touch in upper extremities bilaterally  Spurling's:  negative bilaterally Neck ROM: Full active ROM  NTTP: cervical spinous processes, cervical paraspinal, thoracic paraspinal, left trapezius  TTP right rhomboid, right trapezius Negative Allen's test  Electronically signed by:  Katelyn Cohen [facility] 3:38 PM 07/07/23

## 2023-07-07 ENCOUNTER — Ambulatory Visit (INDEPENDENT_AMBULATORY_CARE_PROVIDER_SITE_OTHER): Payer: BC Managed Care – PPO | Admitting: Sports Medicine

## 2023-07-07 ENCOUNTER — Ambulatory Visit (INDEPENDENT_AMBULATORY_CARE_PROVIDER_SITE_OTHER): Payer: BC Managed Care – PPO

## 2023-07-07 VITALS — BP 130/78 | HR 75 | Ht 65.0 in | Wt 263.0 lb

## 2023-07-07 DIAGNOSIS — M25511 Pain in right shoulder: Secondary | ICD-10-CM | POA: Diagnosis not present

## 2023-07-07 DIAGNOSIS — R200 Anesthesia of skin: Secondary | ICD-10-CM | POA: Diagnosis not present

## 2023-07-07 DIAGNOSIS — M542 Cervicalgia: Secondary | ICD-10-CM

## 2023-07-07 DIAGNOSIS — M47812 Spondylosis without myelopathy or radiculopathy, cervical region: Secondary | ICD-10-CM | POA: Diagnosis not present

## 2023-07-07 MED ORDER — MELOXICAM 15 MG PO TABS: 15.0000 mg | ORAL_TABLET | Freq: Every day | ORAL | 0 refills | Status: AC

## 2023-07-07 NOTE — Patient Instructions (Signed)
-   Start meloxicam 15 mg daily x2 weeks.  If still having pain after 2 weeks, complete 3rd-week of NSAID. May use remaining NSAID as needed once daily for pain control.  Do not to use additional over-the-counter NSAIDs (ibuprofen, naproxen, Advil, Aleve) while taking prescription NSAIDs.  May use Tylenol [phone] mg 2 to 3 times a day for breakthrough pain.  c-spine xray on way out shoulder and neck HEP 4 week follow up

## 2023-07-07 NOTE — Addendum Note (Signed)
Addended by: Richardean Sale on: 07/07/2023 03:39 PM   Modules accepted: Level of Service

## 2023-07-31 NOTE — Progress Notes (Signed)
 [facility] [address] [place] [address] Phone: [phone]   Assessment and Plan:     1. Neck pain 2. Right arm numbness 3. Strain of right trapezius muscle, subsequent encounter -Chronic with exacerbation, subsequent visit - Still most consistent with cervical DDD leading to cervical radiculopathy.  Differential includes thoracic outlet syndrome versus peripheral neuropathy.  Patient's symptoms improved after 2 to 3-week course of meloxicam , however symptoms are returning after completing course - Recommend Tylenol  for day-to-day pain relief - Discontinue meloxicam  15 mg daily.  May start meloxicam  15 mg daily as needed for breakthrough pain.  Recommend limiting chronic NSAIDs to 1-2 doses weekly - Continue HEP and start physical therapy.  I believe patient would benefit from dry needling along cervical paraspinal and trapezius muscles.  Referral sent - Due to failure to improve with >6 weeks of conservative therapy, x-ray findings, pain frequently >6/10, pain affecting day-to-day activities, I recommend further evaluation with cervical spine MRI.  Patient has claustrophobia and requires an open MRI.  Order placed   Pertinent previous records reviewed include none  Follow Up: After MRI has resulted to review imaging and discuss next steps in treatment plan which may include epidural CSI if appropriate   Subjective:   I, Katelyn Cohen, am serving as a neurosurgeon for Doctor Katelyn Cohen   Chief Complaint: right arm pain    HPI:    07/07/2023 Patient is a 57 year old female with right arm pain. Patient states pain is from shoulder to right arm. She gets dead arm / cramping sensation. She will have a twisting sensation/ motion . Isnt able to piliates movements due to pain. This has been going on a year plus. No MOI. Ibu for the pressure point. But not really pain more strangeness of the sensation. Hx of spinal stenosis . Right  thumb becomes numb or hyper sensitive thinks this may have correlation to tender point.   08/04/2023 Patient states that she is doing well. Pain/ inflammation has started come back since stopping meloxicam     Relevant Historical Information: Hypertension  Additional pertinent review of systems negative.   Current Outpatient Medications:    acetaminophen  (TYLENOL ) 500 MG tablet, Take 1,000 mg by mouth every 6 (six) hours as needed for moderate pain., Disp: , Rfl:    Benzonatate (TESSALON PERLES PO), Tessalon Perles, Disp: , Rfl:    cetirizine (ZYRTEC) 10 MG tablet, Take 5 mg by mouth daily as needed for allergies., Disp: , Rfl:    cholecalciferol (VITAMIN D3) 25 MCG (1000 UNIT) tablet, Take 1,000 Units by mouth daily., Disp: , Rfl:    diclofenac sodium (VOLTAREN) 1 % GEL, Apply 2 g topically 4 (four) times daily as needed (pain)., Disp: , Rfl:    fluticasone (FLONASE) 50 MCG/ACT nasal spray, Place 1 spray into both nostrils as needed., Disp: , Rfl:    ibuprofen  (ADVIL ) 200 MG tablet, Take 400 mg by mouth every 6 (six) hours as needed., Disp: , Rfl:    loratadine (CLARITIN) 10 MG tablet, Take 10 mg by mouth as needed., Disp: , Rfl:    losartan -hydrochlorothiazide  (HYZAAR) 100-25 MG tablet, Take 1 tablet by mouth daily., Disp: , Rfl:    meloxicam  (MOBIC ) 15 MG tablet, Take 1 tablet (15 mg total) by mouth daily., Disp: 30 tablet, Rfl: 0   naproxen (NAPROSYN) 500 MG tablet, Take 500 mg by mouth daily., Disp: , Rfl:    ondansetron  (ZOFRAN ) 4 MG tablet, Take  4 mg by mouth every 8 (eight) hours as needed for nausea or vomiting. , Disp: , Rfl:    PROAIR HFA 108 (90 Base) MCG/ACT inhaler, Inhale 1-2 puffs into the lungs every 6 (six) hours as needed for wheezing or shortness of breath., Disp: , Rfl: 0   Semaglutide-Weight Management (WEGOVY) 2.4 MG/0.75ML SOAJ, Inject 2.4 mg into the skin once a week., Disp: , Rfl:    solifenacin  (VESICARE ) 5 MG tablet, TAKE 1 TABLET (5 MG TOTAL) BY MOUTH DAILY. ONE BY  MOUTH EVERY DAY, Disp: 90 tablet, Rfl: 4   vitamin B-12 (CYANOCOBALAMIN) 100 MCG tablet, Take 100 mcg by mouth daily., Disp: , Rfl:    Objective:     Vitals:   08/04/23 0956  Pulse: 77  SpO2: 99%  Weight: 260 lb (117.9 kg)  Height: 5' 5 (1.651 m)      Body mass index is 43.27 kg/m.    Physical Exam:    Neck Exam: Cervical Spine- Posture normal Skin- normal, intact   Neuro:  Strength-   Right Left  Deltoid (C5) 5/5 5/5 Bicep/Brachioradialis (C5/6) 5/5  5/5 Wrist Extension (C6) 5/5 5/5 Tricep (C7) 5/5 5/5 Wrist Flexion (C7) 5/5 5/5 Grip (C8) 5/5 5/5 Finger Abduction (T1) 5/5 5/5   Sensation: intact to light touch in upper extremities bilaterally   Spurling's:  negative bilaterally Neck ROM: Full active ROM  NTTP: cervical spinous processes, cervical paraspinal, thoracic paraspinal, left trapezius  TTP right rhomboid, right trapezius Negative Allen's test      Electronically signed by:  Katelyn Cohen [facility] 10:24 AM 08/04/23

## 2023-08-04 ENCOUNTER — Ambulatory Visit (INDEPENDENT_AMBULATORY_CARE_PROVIDER_SITE_OTHER): Payer: BC Managed Care – PPO | Admitting: Sports Medicine

## 2023-08-04 ENCOUNTER — Other Ambulatory Visit: Payer: Self-pay | Admitting: Sports Medicine

## 2023-08-04 VITALS — HR 77 | Ht 65.0 in | Wt 260.0 lb

## 2023-08-04 DIAGNOSIS — R200 Anesthesia of skin: Secondary | ICD-10-CM

## 2023-08-04 DIAGNOSIS — M542 Cervicalgia: Secondary | ICD-10-CM | POA: Diagnosis not present

## 2023-08-04 DIAGNOSIS — S46811D Strain of other muscles, fascia and tendons at shoulder and upper arm level, right arm, subsequent encounter: Secondary | ICD-10-CM

## 2023-08-04 NOTE — Patient Instructions (Addendum)
 MRI c spine [phone] [facility] [place]-Dry needling See me 5 days afterwards

## 2023-08-17 DIAGNOSIS — C541 Malignant neoplasm of endometrium: Secondary | ICD-10-CM | POA: Diagnosis not present

## 2023-08-17 DIAGNOSIS — E559 Vitamin D deficiency, unspecified: Secondary | ICD-10-CM | POA: Diagnosis not present

## 2023-08-17 DIAGNOSIS — J45909 Unspecified asthma, uncomplicated: Secondary | ICD-10-CM | POA: Diagnosis not present

## 2023-08-17 DIAGNOSIS — I10 Essential (primary) hypertension: Secondary | ICD-10-CM | POA: Diagnosis not present

## 2023-08-28 DIAGNOSIS — M47812 Spondylosis without myelopathy or radiculopathy, cervical region: Secondary | ICD-10-CM | POA: Diagnosis not present

## 2023-09-04 DIAGNOSIS — R051 Acute cough: Secondary | ICD-10-CM | POA: Diagnosis not present

## 2023-09-04 DIAGNOSIS — Z03818 Encounter for observation for suspected exposure to other biological agents ruled out: Secondary | ICD-10-CM | POA: Diagnosis not present

## 2023-09-04 DIAGNOSIS — J101 Influenza due to other identified influenza virus with other respiratory manifestations: Secondary | ICD-10-CM | POA: Diagnosis not present

## 2023-09-05 ENCOUNTER — Ambulatory Visit (INDEPENDENT_AMBULATORY_CARE_PROVIDER_SITE_OTHER): Payer: BC Managed Care – PPO | Admitting: Sports Medicine

## 2023-09-05 VITALS — BP 134/84 | HR 78 | Ht 65.0 in | Wt 260.0 lb

## 2023-09-05 DIAGNOSIS — R200 Anesthesia of skin: Secondary | ICD-10-CM | POA: Diagnosis not present

## 2023-09-05 DIAGNOSIS — M542 Cervicalgia: Secondary | ICD-10-CM | POA: Diagnosis not present

## 2023-09-05 NOTE — Progress Notes (Signed)
 [facility] [address] [place] [address] Phone: [phone]   Assessment and Plan:     1. Neck pain 2. Right arm numbness  -Chronic with exacerbation, subsequent visit - Unclear etiology of continued right upper extremity pain with radicular symptoms.  Differential includes cervical radiculopathy versus thoracic outlet syndrome versus peripheral neuropathy - Patient symptoms had improved after 2 to 3-week course of meloxicam , however symptoms returned after completing medication - Reviewed patient cervical spine MRI which showed degenerative changes at C5-6 that are relatively unchanged compared with imaging from 2020 and primarily left-sided foraminal stenosis which would not clearly explain right sided radicular symptoms.  For continued treatment plan I would recommend either establishing with neurology for second opinion, performing nerve conduction study, or trialing epidural steroid injection.  Patient to contact our clinic and let us  know which path she would like to pursue  Continue HEP and physical therapy - Continue Tylenol  for day-to-day pain relief  Pertinent previous records reviewed include C-spine MRI 09/01/2023  Follow Up:   Patient to contact our clinic and let us  know which path she would like to pursue     Subjective:   I, Katelyn Cohen, am serving as a neurosurgeon for Doctor Morene Mace   Chief Complaint: right arm pain    HPI:    07/07/2023 Patient is a 58 year old female with right arm pain. Patient states pain is from shoulder to right arm. She gets dead arm / cramping sensation. She will have a twisting sensation/ motion . Isnt able to piliates movements due to pain. This has been going on a year plus. No MOI. Ibu for the pressure point. But not really pain more strangeness of the sensation. Hx of spinal stenosis . Right thumb becomes numb or hyper sensitive thinks this may have correlation to tender  point.    08/04/2023 Patient states that she is doing well. Pain/ inflammation has started come back since stopping meloxicam    09/06/2023 Patient states that she is doing fine    Relevant Historical Information: Hypertension  Additional pertinent review of systems negative.   Current Outpatient Medications:    acetaminophen  (TYLENOL ) 500 MG tablet, Take 1,000 mg by mouth every 6 (six) hours as needed for moderate pain., Disp: , Rfl:    Benzonatate (TESSALON PERLES PO), Tessalon Perles, Disp: , Rfl:    cetirizine (ZYRTEC) 10 MG tablet, Take 5 mg by mouth daily as needed for allergies., Disp: , Rfl:    cholecalciferol (VITAMIN D3) 25 MCG (1000 UNIT) tablet, Take 1,000 Units by mouth daily., Disp: , Rfl:    diclofenac sodium (VOLTAREN) 1 % GEL, Apply 2 g topically 4 (four) times daily as needed (pain)., Disp: , Rfl:    fluticasone (FLONASE) 50 MCG/ACT nasal spray, Place 1 spray into both nostrils as needed., Disp: , Rfl:    ibuprofen  (ADVIL ) 200 MG tablet, Take 400 mg by mouth every 6 (six) hours as needed., Disp: , Rfl:    loratadine (CLARITIN) 10 MG tablet, Take 10 mg by mouth as needed., Disp: , Rfl:    losartan -hydrochlorothiazide  (HYZAAR) 100-25 MG tablet, Take 1 tablet by mouth daily., Disp: , Rfl:    meloxicam  (MOBIC ) 15 MG tablet, Take 1 tablet (15 mg total) by mouth daily., Disp: 30 tablet, Rfl: 0   naproxen (NAPROSYN) 500 MG tablet, Take 500 mg by mouth daily., Disp: , Rfl:    ondansetron  (ZOFRAN ) 4 MG tablet, Take 4 mg by  mouth every 8 (eight) hours as needed for nausea or vomiting. , Disp: , Rfl:    PROAIR HFA 108 (90 Base) MCG/ACT inhaler, Inhale 1-2 puffs into the lungs every 6 (six) hours as needed for wheezing or shortness of breath., Disp: , Rfl: 0   Semaglutide-Weight Management (WEGOVY) 2.4 MG/0.75ML SOAJ, Inject 2.4 mg into the skin once a week., Disp: , Rfl:    solifenacin  (VESICARE ) 5 MG tablet, TAKE 1 TABLET (5 MG TOTAL) BY MOUTH DAILY. ONE BY MOUTH EVERY DAY, Disp: 90  tablet, Rfl: 4   vitamin B-12 (CYANOCOBALAMIN) 100 MCG tablet, Take 100 mcg by mouth daily., Disp: , Rfl:    Objective:     Vitals:   09/05/23 1410  BP: 134/84  Pulse: 78  SpO2: 97%  Weight: 260 lb (117.9 kg)  Height: 5' 5 (1.651 m)      Body mass index is 43.27 kg/m.    Physical Exam:    Neck Exam: Cervical Spine- Posture normal Skin- normal, intact   Neuro:  Strength-   Right Left  Deltoid (C5) 5/5 5/5 Bicep/Brachioradialis (C5/6) 5/5  5/5 Wrist Extension (C6) 5/5 5/5 Tricep (C7) 5/5 5/5 Wrist Flexion (C7) 5/5 5/5 Grip (C8) 5/5 5/5 Finger Abduction (T1) 5/5 5/5   Sensation: intact to light touch in upper extremities bilaterally   Spurling's:  negative bilaterally Neck ROM: Full active ROM  NTTP: cervical spinous processes, cervical paraspinal, thoracic paraspinal, left trapezius  TTP right rhomboid, right trapezius Negative Allen's test      Electronically signed by:  Odis Mace [facility] 2:45 PM 09/05/23

## 2023-09-05 NOTE — Patient Instructions (Signed)
Contact our clinic when you decide how you would like to proceed.  We can refer to neurology , order a nerve conduction study , or we could order an epidural steroid

## 2023-10-17 DIAGNOSIS — Z6841 Body Mass Index (BMI) 40.0 and over, adult: Secondary | ICD-10-CM | POA: Diagnosis not present

## 2023-10-17 DIAGNOSIS — R634 Abnormal weight loss: Secondary | ICD-10-CM | POA: Diagnosis not present

## 2023-11-22 ENCOUNTER — Inpatient Hospital Stay: Payer: BC Managed Care – PPO | Attending: Obstetrics & Gynecology | Admitting: Obstetrics & Gynecology

## 2023-11-22 ENCOUNTER — Encounter: Payer: Self-pay | Admitting: Obstetrics & Gynecology

## 2023-11-22 VITALS — BP 126/79 | HR 79 | Temp 97.9°F | Resp 16 | Ht 65.0 in | Wt 271.4 lb

## 2023-11-22 DIAGNOSIS — Z90710 Acquired absence of both cervix and uterus: Secondary | ICD-10-CM | POA: Insufficient documentation

## 2023-11-22 DIAGNOSIS — Z8542 Personal history of malignant neoplasm of other parts of uterus: Secondary | ICD-10-CM | POA: Insufficient documentation

## 2023-11-22 DIAGNOSIS — Z9079 Acquired absence of other genital organ(s): Secondary | ICD-10-CM | POA: Insufficient documentation

## 2023-11-22 DIAGNOSIS — C541 Malignant neoplasm of endometrium: Secondary | ICD-10-CM

## 2023-11-22 DIAGNOSIS — Z90722 Acquired absence of ovaries, bilateral: Secondary | ICD-10-CM | POA: Insufficient documentation

## 2023-11-22 NOTE — Patient Instructions (Signed)
 Return in 1 year ?

## 2023-11-22 NOTE — Assessment & Plan Note (Signed)
 58 y.o.  year old P2 with a history of stage IA grade 1 endometrial cancer (MMRd) s/p robotic hysterectomy and staging on 07/21/20.  Negative symptom review, normal exam.  No evidence of recurrence Assessment and Plan      >continue follow-up every 6 months for 5 years in accordance with NCCN guidelines.  Follow-up with Dr. Annabell Key in 6 months and she will return to this clinic in 1 year

## 2023-11-22 NOTE — Progress Notes (Signed)
 Follow Up Note: Gyn-Onc  Katelyn Cohen 58 y.o. female  CC: She presents for a f/u visit   HPI: The oncology history was reviewed.  Interval History: She denies any vaginal bleeding, abdominal/pelvic pain, cough, lethargy or increasing abdominal girth.  She describes sensations similar to premenstrual symptoms, such as feeling like she might start her period. No other gynecological symptoms or concerns have been reported. Discussed the use of AI scribe software for clinical note transcription with the patient, who gave verbal consent to proceed.    Review of Systems  Review of Systems  Constitutional:  Negative for malaise/fatigue and weight loss.  Respiratory:  Negative for shortness of breath and wheezing.   Cardiovascular:  Negative for chest pain and leg swelling.  Gastrointestinal:  Negative for abdominal pain, blood in stool, constipation, nausea and vomiting.  Genitourinary:  Negative for dysuria, frequency, hematuria and urgency.  Musculoskeletal:  Negative for joint pain and myalgias.  Neurological:  Negative for weakness.  Psychiatric/Behavioral:  Negative for depression. The patient does not have insomnia.    Current medications, allergy, social history, past surgical history, past medical history, family history were all reviewed.    Vitals:  BP 126/79 (BP Location: Left Arm, Patient Position: Sitting)   Pulse 79   Temp 97.9 F (36.6 C) (Oral)   Resp 16   Ht 5\' 5"  (1.651 m)   Wt 271 lb 6.4 oz (123.1 kg)   LMP 07/10/2020   SpO2 100%   BMI 45.16 kg/m    Physical Exam Exam conducted with a chaperone present.  Constitutional:      General: She is not in acute distress. Cardiovascular:     Rate and Rhythm: Normal rate and regular rhythm.  Pulmonary:     Effort: Pulmonary effort is normal.     Breath sounds: Normal breath sounds. No wheezing or rhonchi.  Abdominal:     Palpations: Abdomen is soft.     Tenderness: There is no abdominal tenderness. There is no  right CVA tenderness or left CVA tenderness.     Hernia: No hernia is present.  Genitourinary:    General: Normal vulva.     Urethra: No urethral lesion.     Vagina: No lesions. No bleeding Musculoskeletal:     Cervical back: Neck supple.     Right lower leg: No edema.     Left lower leg: No edema.  Lymphadenopathy:     Upper Body:     Right upper body: No supraclavicular adenopathy.     Left upper body: No supraclavicular adenopathy.     Lower Body: No right inguinal adenopathy. No left inguinal adenopathy.  Skin:    Findings: No rash.  Neurological:     Mental Status: She is oriented to person, place, and time.   Assessment/Plan:  Endometrial cancer [facility]) 58 y.o.  year old P2 with a history of stage IA grade 1 endometrial cancer (MMRd) s/p robotic hysterectomy and staging on 07/21/20.  Negative symptom review, normal exam.  No evidence of recurrence Assessment and Plan      >continue follow-up every 6 months for 5 years in accordance with NCCN guidelines.  Follow-up with Dr. Annabell Key in 6 months and she will return to this clinic in 1 year     I personally spent 25 minutes face-to-face and non-face-to-face in the care of this patient, which includes all pre, intra, and post visit time on the date of service.   Abdul Hodgkin, MD

## 2023-12-14 DIAGNOSIS — J302 Other seasonal allergic rhinitis: Secondary | ICD-10-CM | POA: Diagnosis not present

## 2023-12-14 DIAGNOSIS — J029 Acute pharyngitis, unspecified: Secondary | ICD-10-CM | POA: Diagnosis not present

## 2024-06-15 ENCOUNTER — Other Ambulatory Visit (HOSPITAL_BASED_OUTPATIENT_CLINIC_OR_DEPARTMENT_OTHER): Payer: Self-pay | Admitting: Obstetrics & Gynecology

## 2024-06-15 DIAGNOSIS — R350 Frequency of micturition: Secondary | ICD-10-CM

## 2024-07-05 ENCOUNTER — Ambulatory Visit (HOSPITAL_BASED_OUTPATIENT_CLINIC_OR_DEPARTMENT_OTHER): Payer: Self-pay | Admitting: Obstetrics & Gynecology

## 2024-07-16 NOTE — Progress Notes (Unsigned)
° °  ANNUAL EXAM Patient name: Katelyn Cohen MRN [id number]  Date of birth: [birth year] Chief Complaint:   No chief complaint on file.  History of Present Illness:   Katelyn Cohen is a 58 y.o. [phone] Caucasian female being seen today for a routine annual exam.  Current complaints: ***  Patient's last menstrual period was 07/10/2020.   The pregnancy intention screening data noted above was reviewed. Potential methods of contraception were discussed. The patient elected to proceed with No data recorded.   Last pap 05/22/2020. Results were: NILM w/ HRHPV negative. H/O abnormal pap: {yes/yes***/no:23866} Last mammogram: 11/15/2022. Results were: normal. Family h/o breast cancer: {yes***/no:23838} Last colonoscopy: 01/11/2023. Results were: abnormal four polyps. Family h/o colorectal cancer: {yes***/no:23838}     06/22/2023    3:36 PM 06/02/2022    9:28 AM 05/24/2021   11:43 AM  Depression screen PHQ 2/9  Decreased Interest 0 0 0  Down, Depressed, Hopeless 0 0 0  PHQ - 2 Score 0 0 0         No data to display           Review of Systems:   Pertinent items are noted in HPI Denies any headaches, blurred vision, fatigue, shortness of breath, chest pain, abdominal pain, abnormal vaginal discharge/itching/odor/irritation, problems with periods, bowel movements, urination, or intercourse unless otherwise stated above. Pertinent History Reviewed:  Reviewed past medical,surgical, social and family history.  Reviewed problem list, medications and allergies. Physical Assessment:  There were no vitals filed for this visit.There is no height or weight on file to calculate BMI.        Physical Examination:   General appearance - well appearing, and in no distress  Mental status - alert, oriented to person, place, and time  Psych:  She has a normal mood and affect  Skin - warm and dry, normal color, no suspicious lesions noted  Chest - effort normal, all lung fields clear to auscultation  bilaterally  Heart - normal rate and regular rhythm  Neck:  midline trachea, no thyromegaly or nodules  Breasts - breasts appear normal, no suspicious masses, no skin or nipple changes or  axillary nodes  Abdomen - soft, nontender, nondistended, no masses or organomegaly  Pelvic - VULVA: normal appearing vulva with no masses, tenderness or lesions  VAGINA: normal appearing vagina with normal color and discharge, no lesions  CERVIX: normal appearing cervix without discharge or lesions, no CMT  Thin prep pap is {Desc; done/not:10129} *** HR HPV cotesting  UTERUS: uterus is felt to be normal size, shape, consistency and nontender   ADNEXA: No adnexal masses or tenderness noted.  Rectal - normal rectal, good sphincter tone, no masses felt. Hemoccult: ***  Extremities:  No swelling or varicosities noted  Chaperone present for exam  No results found for this or any previous visit (from the past 24 hours).  Assessment & Plan:  1) Well-Woman Exam  2) ***  Labs/procedures today: ***  Mammogram: {Mammo f/u:25212::@ 58yo}, or sooner if problems Colonoscopy: {TCS f/u:25213::@ 58yo}, or sooner if problems  No orders of the defined types were placed in this encounter.   Meds: No orders of the defined types were placed in this encounter.   Follow-up: No follow-ups on file.  Katelyn LOISE Quale, RN 07/16/2024 1:57 PM

## 2024-07-17 ENCOUNTER — Encounter (HOSPITAL_BASED_OUTPATIENT_CLINIC_OR_DEPARTMENT_OTHER): Payer: Self-pay | Admitting: Obstetrics & Gynecology

## 2024-07-17 ENCOUNTER — Ambulatory Visit (INDEPENDENT_AMBULATORY_CARE_PROVIDER_SITE_OTHER): Admitting: Obstetrics & Gynecology

## 2024-07-17 VITALS — BP 129/89 | HR 100 | Ht 66.0 in | Wt 253.4 lb

## 2024-07-17 DIAGNOSIS — Z01419 Encounter for gynecological examination (general) (routine) without abnormal findings: Secondary | ICD-10-CM

## 2024-07-17 DIAGNOSIS — Z1331 Encounter for screening for depression: Secondary | ICD-10-CM | POA: Diagnosis not present

## 2024-07-17 DIAGNOSIS — C541 Malignant neoplasm of endometrium: Secondary | ICD-10-CM

## 2024-07-17 DIAGNOSIS — Z8542 Personal history of malignant neoplasm of other parts of uterus: Secondary | ICD-10-CM

## 2024-07-17 DIAGNOSIS — R350 Frequency of micturition: Secondary | ICD-10-CM | POA: Diagnosis not present

## 2024-07-17 DIAGNOSIS — Z23 Encounter for immunization: Secondary | ICD-10-CM

## 2024-07-17 DIAGNOSIS — Z90710 Acquired absence of both cervix and uterus: Secondary | ICD-10-CM

## 2024-07-17 MED ORDER — SOLIFENACIN SUCCINATE 5 MG PO TABS: 5.0000 mg | ORAL_TABLET | Freq: Every day | ORAL | 3 refills | Status: AC

## 2024-07-17 NOTE — Addendum Note (Signed)
 Addended by: VAN MORNA SAILOR on: 07/17/2024 04:54 PM   Modules accepted: Orders

## 2024-11-06 ENCOUNTER — Inpatient Hospital Stay: Admitting: Obstetrics & Gynecology

## 2025-07-28 ENCOUNTER — Ambulatory Visit (HOSPITAL_BASED_OUTPATIENT_CLINIC_OR_DEPARTMENT_OTHER): Payer: Self-pay | Admitting: Obstetrics & Gynecology
